# Patient Record
Sex: Female | Born: 1960 | Race: Black or African American | Hispanic: No | Marital: Single | Smoking: Never smoker
Health system: Southern US, Community
[De-identification: ages and names within clinical notes are randomized; demographics above are authoritative.]

## PROBLEM LIST (undated history)

## (undated) DIAGNOSIS — H269 Unspecified cataract: Secondary | ICD-10-CM

## (undated) DIAGNOSIS — E119 Type 2 diabetes mellitus without complications: Secondary | ICD-10-CM

## (undated) DIAGNOSIS — H35039 Hypertensive retinopathy, unspecified eye: Secondary | ICD-10-CM

## (undated) DIAGNOSIS — K5792 Diverticulitis of intestine, part unspecified, without perforation or abscess without bleeding: Secondary | ICD-10-CM

## (undated) DIAGNOSIS — H3320 Serous retinal detachment, unspecified eye: Secondary | ICD-10-CM

## (undated) DIAGNOSIS — I10 Essential (primary) hypertension: Secondary | ICD-10-CM

## (undated) HISTORY — PX: OTHER SURGICAL HISTORY: SHX169

## (undated) HISTORY — DX: Hypertensive retinopathy, unspecified eye: H35.039

## (undated) HISTORY — PX: REPLACEMENT TOTAL KNEE: SUR1224

## (undated) HISTORY — DX: Diverticulitis of intestine, part unspecified, without perforation or abscess without bleeding: K57.92

## (undated) HISTORY — PX: BREAST SURGERY: SHX581

## (undated) HISTORY — DX: Unspecified cataract: H26.9

---
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
## 1998-12-14 ENCOUNTER — Other Ambulatory Visit: Admission: RE | Admit: 1998-12-14 | Discharge: 1998-12-14 | Payer: Self-pay | Admitting: Obstetrics and Gynecology

## 1999-04-14 ENCOUNTER — Other Ambulatory Visit: Admission: RE | Admit: 1999-04-14 | Discharge: 1999-04-14 | Payer: Self-pay | Admitting: Obstetrics and Gynecology

## 2002-03-16 ENCOUNTER — Other Ambulatory Visit: Admission: RE | Admit: 2002-03-16 | Discharge: 2002-03-16 | Payer: Self-pay | Admitting: Obstetrics and Gynecology

## 2002-05-18 ENCOUNTER — Encounter: Payer: Self-pay | Admitting: Obstetrics and Gynecology

## 2002-05-18 ENCOUNTER — Ambulatory Visit (HOSPITAL_COMMUNITY): Admission: RE | Admit: 2002-05-18 | Discharge: 2002-05-18 | Payer: Self-pay | Admitting: Obstetrics and Gynecology

## 2002-08-26 ENCOUNTER — Ambulatory Visit (HOSPITAL_COMMUNITY): Admission: RE | Admit: 2002-08-26 | Discharge: 2002-08-26 | Payer: Self-pay | Admitting: Obstetrics and Gynecology

## 2002-08-26 ENCOUNTER — Encounter: Payer: Self-pay | Admitting: Obstetrics and Gynecology

## 2002-10-01 ENCOUNTER — Inpatient Hospital Stay (HOSPITAL_COMMUNITY): Admission: AD | Admit: 2002-10-01 | Discharge: 2002-10-01 | Payer: Self-pay | Admitting: Obstetrics and Gynecology

## 2002-10-14 ENCOUNTER — Inpatient Hospital Stay (HOSPITAL_COMMUNITY): Admission: AD | Admit: 2002-10-14 | Discharge: 2002-10-14 | Payer: Self-pay | Admitting: Obstetrics and Gynecology

## 2002-10-15 ENCOUNTER — Inpatient Hospital Stay (HOSPITAL_COMMUNITY): Admission: AD | Admit: 2002-10-15 | Discharge: 2002-10-17 | Payer: Self-pay | Admitting: Obstetrics and Gynecology

## 2006-02-27 ENCOUNTER — Other Ambulatory Visit: Admission: RE | Admit: 2006-02-27 | Discharge: 2006-02-27 | Payer: Self-pay | Admitting: Obstetrics and Gynecology

## 2008-02-20 ENCOUNTER — Encounter: Payer: Self-pay | Admitting: Orthopedic Surgery

## 2008-02-20 ENCOUNTER — Ambulatory Visit (HOSPITAL_COMMUNITY): Admission: RE | Admit: 2008-02-20 | Discharge: 2008-02-20 | Payer: Self-pay | Admitting: Pediatrics

## 2008-02-24 ENCOUNTER — Encounter (HOSPITAL_COMMUNITY): Admission: RE | Admit: 2008-02-24 | Discharge: 2008-03-25 | Payer: Self-pay | Admitting: Pediatrics

## 2008-03-30 ENCOUNTER — Encounter (HOSPITAL_COMMUNITY): Admission: RE | Admit: 2008-03-30 | Discharge: 2008-04-29 | Payer: Self-pay | Admitting: Pediatrics

## 2008-04-06 ENCOUNTER — Ambulatory Visit: Payer: Self-pay | Admitting: Orthopedic Surgery

## 2008-04-06 DIAGNOSIS — M1710 Unilateral primary osteoarthritis, unspecified knee: Secondary | ICD-10-CM

## 2008-04-06 DIAGNOSIS — IMO0002 Reserved for concepts with insufficient information to code with codable children: Secondary | ICD-10-CM | POA: Insufficient documentation

## 2010-01-16 ENCOUNTER — Ambulatory Visit: Payer: Self-pay | Admitting: Orthopedic Surgery

## 2010-01-18 ENCOUNTER — Encounter: Payer: Self-pay | Admitting: Orthopedic Surgery

## 2010-05-15 ENCOUNTER — Encounter (INDEPENDENT_AMBULATORY_CARE_PROVIDER_SITE_OTHER): Payer: Self-pay | Admitting: *Deleted

## 2010-05-15 ENCOUNTER — Ambulatory Visit: Payer: Self-pay | Admitting: Orthopedic Surgery

## 2010-05-19 ENCOUNTER — Encounter (INDEPENDENT_AMBULATORY_CARE_PROVIDER_SITE_OTHER): Payer: Self-pay | Admitting: *Deleted

## 2010-05-26 ENCOUNTER — Encounter (INDEPENDENT_AMBULATORY_CARE_PROVIDER_SITE_OTHER): Payer: Self-pay | Admitting: *Deleted

## 2010-06-06 ENCOUNTER — Encounter: Payer: Self-pay | Admitting: Orthopedic Surgery

## 2010-06-09 ENCOUNTER — Inpatient Hospital Stay (HOSPITAL_COMMUNITY): Admission: RE | Admit: 2010-06-09 | Discharge: 2010-06-13 | Payer: Self-pay | Admitting: Orthopedic Surgery

## 2010-06-09 ENCOUNTER — Ambulatory Visit: Payer: Self-pay | Admitting: Orthopedic Surgery

## 2010-06-15 ENCOUNTER — Telehealth: Payer: Self-pay | Admitting: Orthopedic Surgery

## 2010-06-16 ENCOUNTER — Encounter: Payer: Self-pay | Admitting: Orthopedic Surgery

## 2010-06-16 ENCOUNTER — Telehealth: Payer: Self-pay | Admitting: Orthopedic Surgery

## 2010-06-20 ENCOUNTER — Telehealth: Payer: Self-pay | Admitting: Orthopedic Surgery

## 2010-06-20 ENCOUNTER — Encounter: Payer: Self-pay | Admitting: Orthopedic Surgery

## 2010-06-21 ENCOUNTER — Encounter: Payer: Self-pay | Admitting: Orthopedic Surgery

## 2010-06-21 ENCOUNTER — Telehealth: Payer: Self-pay | Admitting: Orthopedic Surgery

## 2010-06-22 ENCOUNTER — Ambulatory Visit: Payer: Self-pay | Admitting: Orthopedic Surgery

## 2010-06-22 DIAGNOSIS — Z96659 Presence of unspecified artificial knee joint: Secondary | ICD-10-CM

## 2010-06-28 ENCOUNTER — Encounter: Payer: Self-pay | Admitting: Orthopedic Surgery

## 2010-07-03 ENCOUNTER — Telehealth (INDEPENDENT_AMBULATORY_CARE_PROVIDER_SITE_OTHER): Payer: Self-pay | Admitting: *Deleted

## 2010-07-11 ENCOUNTER — Encounter (HOSPITAL_COMMUNITY): Admission: RE | Admit: 2010-07-11 | Discharge: 2010-08-10 | Payer: Self-pay | Admitting: Orthopedic Surgery

## 2010-07-24 ENCOUNTER — Ambulatory Visit: Payer: Self-pay | Admitting: Orthopedic Surgery

## 2010-07-25 ENCOUNTER — Encounter: Payer: Self-pay | Admitting: Orthopedic Surgery

## 2010-08-01 ENCOUNTER — Encounter: Payer: Self-pay | Admitting: Orthopedic Surgery

## 2010-08-11 ENCOUNTER — Encounter (HOSPITAL_COMMUNITY): Admission: RE | Admit: 2010-08-11 | Discharge: 2010-09-10 | Payer: Self-pay | Admitting: Orthopedic Surgery

## 2010-08-29 ENCOUNTER — Encounter: Payer: Self-pay | Admitting: Orthopedic Surgery

## 2010-09-04 ENCOUNTER — Ambulatory Visit: Payer: Self-pay | Admitting: Orthopedic Surgery

## 2010-09-07 ENCOUNTER — Encounter: Payer: Self-pay | Admitting: Orthopedic Surgery

## 2010-10-12 ENCOUNTER — Ambulatory Visit: Payer: Self-pay | Admitting: Orthopedic Surgery

## 2011-01-09 NOTE — Assessment & Plan Note (Signed)
Summary: 4 WK RE-CK RT KNEE/TKA SURG 06/09/10/BCBS/CAF   Visit Type:  post op Referring Provider:  Dr. Acey Lav  CC:  right knee replacement.  History of Present Illness: I saw Nancy [name] in the office today for a 1 month followup visit.  She is a 50 years old woman with the complaint of:  right knee replacement.  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  Her knee looks great position looks good.  Flexion 120 extension full extension with no lag  X-rays of the RIGHT knee The  alignment was normal, PTF reduced without tilt or subluxation, no evidence of loosening.  IMPRESSION: normal appearance of the implant    Followup 6 weeks continue therapy    Allergies: 1)  ! Pcn   Other Orders: Post-Op Check ([address]) Knee x-ray,  3 views ([address])  Patient Instructions: 1)  6 weeks f/u

## 2011-01-09 NOTE — Progress Notes (Signed)
Summary: out-patient therapy order request  Phone Note Other Incoming   Caller: [facility] Summary of Call: [facility] called for order for out-patient therapy; states rec'd call to schedule per [facility]. Initial call taken by: Cammie Sickle,  July 03, 2010 2:30 PM  Follow-up for Phone Call        ok order faxed to [facility] pt Follow-up by: Ether Griffins,  July 03, 2010 2:34 PM

## 2011-01-09 NOTE — Assessment & Plan Note (Signed)
Summary: 6 WK F/U RT KNEE/POST OP 06/09/10/BSF   Visit Type:  Follow-up Referring Provider:  Dr. Acey Lav  CC:  right knee replacement.  History of Present Illness: I saw Nancy [name] in the office today for a 6 week  followup visit.  She is a 50 years old woman with the complaint of:  right knee replacement.  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  Medications Robaxin 500.  She has finished therapy.  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 Pcn   Impression & Recommendations:  Problem # 1:  TOTAL KNEE FOLLOW-UP (ICD-V43.65) Assessment Comment Only  Orders: Post-Op Check (42706)  Problem # 2:  LOWER LEG, ARTHRITIS, DEGEN./OSTEO (ICD-715.96) Assessment: Comment Only  Her updated medication list for this problem includes:    Naproxen 500 Mg Tabs (Naproxen)    Norco 5-325 Mg Tabs (Hydrocodone-acetaminophen) .Marland Kitchen... 1 by mouth q 4 as needed pain  Orders: Post-Op Check (23762)  Patient Instructions: 1)  Please schedule a follow-up appointment in 1 month.

## 2011-01-09 NOTE — Letter (Signed)
Summary: Prior review Passive motion form   Prior review Passive motion form   Imported By: Cammie Sickle 06/07/2010 17:19:08  _____________________________________________________________________  External Attachment:    Type:   Image     Comment:   External Document

## 2011-01-09 NOTE — Miscellaneous (Signed)
Summary: [facility] verbal order for equipment  [facility] verbal order for equipment   Imported By: Jacklynn Ganong 06/21/2010 12:01:53  _____________________________________________________________________  External Attachment:    Type:   Image     Comment:   External Document

## 2011-01-09 NOTE — Miscellaneous (Signed)
Summary: [facility] home health discharge  [facility] home health discharge   Imported By: Jacklynn Ganong 07/25/2010 16:52:17  _____________________________________________________________________  External Attachment:    Type:   Image     Comment:   External Document

## 2011-01-09 NOTE — Miscellaneous (Signed)
Summary: faxed info to [facility] and [facility] for surgery info  Clinical Lists Changes

## 2011-01-09 NOTE — Miscellaneous (Signed)
Summary: PT Discharge summary  PT Discharge summary   Imported By: Jacklynn Ganong 08/29/2010 12:20:36  _____________________________________________________________________  External Attachment:    Type:   Image     Comment:   External Document

## 2011-01-09 NOTE — Miscellaneous (Signed)
Summary: [facility] orders  [facility] orders   Imported By: Jacklynn Ganong 06/26/2010 08:00:29  _____________________________________________________________________  External Attachment:    Type:   Image     Comment:   External Document

## 2011-01-09 NOTE — Miscellaneous (Signed)
Summary: pre-cert information in-patient surgery  Clinical Lists Changes  ontacted insurer [facility] re: in-patient surgery scheduled for 7/1/1, [facility], [place] [address], Icd9 [phone]. Spoke w/Pamela R in [employer].  Initially approved for up to 7 days; Pre-Auth # [id number].   06/02/10 Pre-approved with above Berkley Harvey #.  Clinicals would be needed only if in-patient for >7 days, per [facility].

## 2011-01-09 NOTE — Assessment & Plan Note (Signed)
Summary: POST OP 1/TKA RT/SURG 06/09/10/BCBS/CAF   Visit Type:  Follow-up Referring Provider:  Dr. Acey Lav  CC:  postoperative visit status post RIGHT knee replacement.  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  Complaints doing well, still calf and muscle soreness.    Medications Robaxin 500, Oxycodone 2 q 4 hrs as needed pain, Oxycodone 5 immediate release q 3 hrs.  PT ADVANCED   Allergies: 1)  ! Pcn  Physical Exam  Additional Exam:  her incision looks very nice she does have somelower leg edema her flexion is about 75 in the office she is tight in extension about 5     Impression & Recommendations:  Problem # 1:  LOWER LEG, ARTHRITIS, DEGEN./OSTEO (ICD-715.96) Assessment Comment Only  continued therapy him back 4 weeks no x-ray needed  Orders: Post-Op Check (21308)  Patient Instructions: 1)  Come back in 4 weeks 2)  recheck right knee

## 2011-01-09 NOTE — Letter (Signed)
Summary: [employer] disability form  [employer] disability form   Imported By: Cammie Sickle 06/20/2010 09:54:35  _____________________________________________________________________  External Attachment:    Type:   Image     Comment:   External Document

## 2011-01-09 NOTE — Miscellaneous (Signed)
Summary: Home care PT progress note  Home care PT progress note   Imported By: Jacklynn Ganong 06/26/2010 07:59:48  _____________________________________________________________________  External Attachment:    Type:   Image     Comment:   External Document

## 2011-01-09 NOTE — Progress Notes (Signed)
Summary: call from patient, states CPM denied  Phone Note Call from Patient   Caller: Patient Summary of Call: Patient called to relay that her insurance denied the CPM machine and is very concerned.  I foll'd up with [employer], our CPM/medical equipment provider via email.  Per Racheal Bond, states they rec'd the requested form signed by Dr Romeo Apple; however, [facility] still denied based on " patient low mobility".   Advised patient, we are checking for further information. Initial call taken by: Cammie Sickle,  June 15, 2010 4:33 PM

## 2011-01-09 NOTE — Progress Notes (Signed)
Summary: When to check protime again?  Phone Note From Other Clinic   Summary of Call: Jennifer/[facility] called about Nancy [name] (Jul 27, 2061).  Protime was  done 06/16/10, tomorrow, 06/21/10 is her last home visit that insurance will allow.  Do you want it checked again tomorrow or stop the Coumadin?  Jennifer's # [phone] Initial call taken by: Jacklynn Ganong,  June 20, 2010 3:41 PM  Follow-up for Phone Call        check tomorrow Follow-up by: Fuller Canada MD,  June 20, 2010 5:05 PM  Additional Follow-up for Phone Call Additional follow up Details #1::        Gave doctor's reply to [facility]  at [facility] Additional Follow-up by: Jacklynn Ganong,  June 20, 2010 5:12 PM

## 2011-01-09 NOTE — Miscellaneous (Signed)
Summary: [facility] plan of care  [facility] plan of care   Imported By: Jacklynn Ganong 06/29/2010 10:33:35  _____________________________________________________________________  External Attachment:    Type:   Image     Comment:   External Document

## 2011-01-09 NOTE — Miscellaneous (Signed)
Summary: PT Clinical evaluation  PT Clinical evaluation   Imported By: Jacklynn Ganong 08/01/2010 11:38:43  _____________________________________________________________________  External Attachment:    Type:   Image     Comment:   External Document

## 2011-01-09 NOTE — Assessment & Plan Note (Signed)
Summary: RT KNEE PAIN NO RECENT XR/BCBS/R TORRENS/BSF   Visit Type:  Follow-up Referring Provider:  Dr. Acey Lav  CC:  right knee pain.  History of Present Illness: I saw Nancy [name] in the office today for a followup visit.  She is a 50 years old woman with the complaint of:  right knee pain.I last saw her one year ago at that time she was having knee pain and she started having pain again in November of this year.  She complains of pain in the entire knee which is worsened by cold air.  We treated her last year with a brace and physical therapy and she did well.  He started having severe swelling again in the RIGHT knee.  She has severe pain at this time.  There is deformity.  meds: Flexeril 10mg  and Naproxen 500mg , Voltaren gel.  Xrays today.  Current Medications (verified): 1)  Naproxen 500 Mg  Tabs (Naproxen) 2)  Albuterol Inhaler .... Prn  Allergies (verified): 1)  ! Pcn  Past History:  Past Medical History: Last updated: 04/06/2008 asthma  Past Surgical History: Last updated: 04/06/2008 right foot  Family History: Last updated: 04/06/2008 FH of Anesthesia Problems:  Family History of Diabetes  Social History: Last updated: 04/06/2008 Patient is single.  work with computers  Risk Factors: Caffeine Use: 0 (04/06/2008)  Risk Factors: Smoking Status: never (04/06/2008)  Review of Systems General:  Denies weight loss, weight gain, fever, chills, and fatigue. Cardiac :  Denies chest pain, angina, and heart attack. Neuro:  complains of numbness in the second and third digit of the LEFT foot.  Physical Exam  Msk:  The patient is well developed and nourished, with normal grooming and hygiene. The body habitus is   Pulses:  pulses normal in all 4 extremities Extremities:  the gait and station were abnormal and she favored the RIGHT lower extremity  She has normal upper extremities on inspection.  She has full range of motion.  There was no evidence of joint  subluxation or laxity.  There is no atrophy or tremor muscle tone was normal.  Skin was intact.  RIGHT lower extremity showed large joint effusion she had medial tenderness as well as lateral tenderness or suprapatellar tenderness.  Range of motion was limited by the effusion to only 90.  There is no joint contracture.  The knee was stable.  Muscle strength and tone were normal.  Skin was normal. Neurologic:  The coordination and sensation were normal  The reflexes were normal   Skin:  intact without lesions or rashes Inguinal Nodes:  no significant adenopathy Psych:  alert and cooperative; normal mood and affect; normal attention span and concentration   Impression & Recommendations:  Problem # 1:  LOWER LEG, ARTHRITIS, DEGEN./OSTEO (ICD-715.96) Assessment Deteriorated  Her updated medication list for this problem includes:    Naproxen 500 Mg Tabs (Naproxen)  Orders: New Patient Level IV (72536) Joint Aspirate / Injection, Large (20610) Depo- Medrol 40mg  (J1030) Knee x-ray,  3 views (64[address])  There is severe arthritis of the knee.  There is joint space narrowing and osteophytes.  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  I agree that she needs surgery.  Patient Instructions: 1)  You have received an injection of cortisone today. You may experience increased pain at the injection site. Apply ice pack to the area for 20 minutes every 2 hours  and take 2 xtra strength tylenol every 8 hours. This increased pain will usually resolve in 24 hours. The injection will take effect in 3-10 days.  2)  return end of May for preop

## 2011-01-09 NOTE — Progress Notes (Signed)
Summary: PT/INR results  Phone Note From Other Clinic   Summary of Call: [facility] called about Nancy [name] (September 04, 2061)     INR  1.4   PT   16.3  she is taking 5 mg Coumadin  Cathy's # [phone] Initial call taken by: Jacklynn Ganong,  June 16, 2010 9:20 AM

## 2011-01-09 NOTE — Letter (Signed)
Summary: *Orthopedic Consult Note  [facility]  [address]  [place], [place] [address]   Phone: [phone]  Fax: [phone]    Re:    Nancy [name] DOB:    [birth year]   Dear: Brett Canales   Thank you for requesting that we see the above patient for consultation.  A copy of the detailed office note will be sent under separate cover, for your review.  Evaluation today is consistent with:    Our recommendation is for: RIGHT total knee arthroplasty, the patient like to wait until June, we'll see her in May to schedule       Thank you for this opportunity to look after your patient.  Sincerely,   Terrance Mass. MD.

## 2011-01-09 NOTE — Letter (Signed)
Summary: Work [facility]  [address]  [place], [place] [address]   Phone: [phone]  Fax: [phone]     Today's Date: January 16, 2010  Name of Patient: Nancy [name]  The above named patient had a medical visit today at: 2:00 pm.  Please take this into consideration when reviewing the time away from work/school.    Special Instructions:    [ X ] To be off the remainder of today, released to return to the normal work / school schedule tomorrow, or next scheduled work day.   [  ] Other ________________________________________________________________ ________________________________________________________________________   Sincerely,   Terrance Mass, MD

## 2011-01-09 NOTE — Letter (Signed)
Summary: Work [facility]  [address]  [place], [place] [address]   Phone: [phone]  Fax: [phone]     Today's Date: May 15, 2010  Name of Patient: Nancy [name]  The above named patient had a medical visit in our office today.  Please take this into consideration when reviewing the time away from work/school.    Special Instructions:   [ X ] To be off the remainder of today, returning to the normal work schedule tomorrow,       05/16/10..  [  ] To be off until the next scheduled appointment on ______________________.  [  ] Other ________________________________________________________________ ________________________________________________________________________   Sincerely,,   Terrance Mass, MD

## 2011-01-09 NOTE — Miscellaneous (Signed)
Summary: PT Progress note  PT Progress note   Imported By: Jacklynn Ganong 08/01/2010 11:24:45  _____________________________________________________________________  External Attachment:    Type:   Image     Comment:   External Document

## 2011-01-09 NOTE — Letter (Signed)
Summary: Out of Work Part time job  [facility]  [address]  [place], [place] [address]   Phone: [phone]  Fax: [phone]    May 15, 2010   Employee:  Nancy [name]    To Whom It May Concern:    For Medical reasons, please note that the above named employee's last day to be available for work is the following dates:  Tuesday,  05/30/10   Out of work:  Start:  06/05/10  End:   09/05/10  Estimated return to work:  09/11/10    If you need additional information, please feel free to contact our office.         Sincerely,    Terrance Mass, MD

## 2011-01-09 NOTE — Assessment & Plan Note (Signed)
Summary: 1 M RE-CK RT KNEE/POST OP TKA 06/09/10 BCBS/CAF   Visit Type:  Follow-up Referring Provider:  Dr. Acey Lav  CC:  post op TKA.  History of Present Illness: I saw Nancy [name] in the office today for a 6 week  followup visit.  She is a 50 years old woman with the complaint of:  right knee replacement.  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  Medications Robaxin 500, Norco 5 as needed.  Today she complains of stiffness after sitting for a long period of time and there is some weakness when she is walking including going up the stairs.  She denies any back pain.  She does use her cane occasionally.   on examination her knee is completely straight and flat she has flexion of 110.  She has a good straight leg raise.  Her knee is stable.  No tenderness is noted and there is free range of motion with no patellar subluxation  I recommended some knee exercises straight leg raise, terminal knee extension quad sets to help with her strength and followup in 3 months for x-rays of the total knee replacement  Allergies: 1)  ! Pcn   Medications Added to Medication List This Visit: 1)  Robaxin 500 Mg Tabs (Methocarbamol) .Marland Kitchen.. 1 by mouth q 8 hrs as needed pain  Other Orders: Est. Patient Level II ([address])  Patient Instructions: 1)  CONTINUE KNEE EXERCISES  2)  RETURN IN 3 MONTHS FOR XRAYS OF THE RIGHT KNEE  Prescriptions: ROBAXIN 500 MG TABS (METHOCARBAMOL) 1 by mouth q 8 hrs as needed pain  #60 x 1   Entered and Authorized by:   Fuller Canada MD   Signed by:   Fuller Canada MD on 10/12/2010   Method used:   Faxed to ...       [facility]* (retail)       [address]       [place], [place]  [address]       Ph: [phone]       Fax: [phone]   RxID:   [id number]    Orders Added: 1)  Est. Patient Level II [[address]]

## 2011-01-09 NOTE — Letter (Signed)
Summary: Out of Work  [facility]  [address]  [place], [place] [address]   Phone: [phone]  Fax: [phone]    September 04, 2010   Employee:  Hildagarde L Para    To Whom It May Concern:   May return to work Oct 3rd 2011     Sincerely,    Fuller Canada MD

## 2011-01-09 NOTE — Progress Notes (Signed)
Summary: call back from [facility]  Phone Note Other Incoming   Caller: Home health nurse Summary of Call: Jennifer,[facility], called to report no pro time was done, as patient had stopped her Coumadin on Sun, 06/18/10. Said that today is her last scheduled visit w/patient.  Fol up appt scheduled here tomorrow, 06/22/10. Initial call taken by: Cammie Sickle,  June 21, 2010 12:37 PM

## 2011-01-09 NOTE — Assessment & Plan Note (Signed)
Summary: RE-CK/DISCUSS SURGERY RT KNEE/?NEW XRAY/BCBS/CAF   Visit Type:  Follow-up Referring Provider:  Dr. Acey Lav  CC:  right knee pain.  History of Present Illness: I saw Nancy [name] in the office today for a followup visit.  She is a 50 years old woman with the complaint of:  right knee pain, OA, discuss knee surgery.  She c/o severe knee pain which was gradual in onset and not associated with mechanical signs. She was treated with thearpy, bracing and naproxen and she improved. She had a xray done 02/20/08 at [facility].   She also had an injection and her pain was resolved for a while but then came back.  It is now severe.  She lost motion.  She has swelling.  She can walk up steps.  She would like to proceed with knee replacement surgery.   Updated Prior Medication List:  NAPROXEN 500 MG  TABS (NAPROXEN)  * ALBUTEROL INHALER prn  Current Allergies: ! PCN  Past Medical History:    asthma  Past Surgical History:    right foot   Family History:    FH of Anesthesia Problems:     Family History of Diabetes  Social History:    Patient is single.     work with computers   Risk Factors:   Tobacco use:  never Caffeine use:  0 drinks per day Alcohol use:  no     Allergies: 1)  ! Pcn  Past History:  Past Medical History: Last updated: 04/06/2008 asthma  Past Surgical History: Last updated: 04/06/2008 right foot  Family History: Last updated: 04/06/2008 FH of Anesthesia Problems:  Family History of Diabetes  Social History: Last updated: 04/06/2008 Patient is single.  work with computers  Risk Factors: Caffeine Use: 0 (04/06/2008)  Risk Factors: Smoking Status: never (04/06/2008)  Physical Exam  Additional Exam:  Knee Exam  General:    Well-developed, well-nourished, normal body habitus; no deformities, normal grooming.  Gait:    Normal heel-toe gait pattern bilaterally.    Skin:    Intact, no scars, lesions, rashes, cafe au lait spots,  or bruising.    Inspection:     No deformity, ecchymosis or swelling.   Palpation:    Non-tender to palpation over medial joint line, lateral joint line, parapatellar, condylar, patellar tendon, or Pes bursa.   Vascular:    There was no swelling or varicose veins. The pulses and temperature are normal. There was no edema or tenderness.  Sensory:    Gross coordination and sensation were normal.    Motor:    Motor strength 5/5 bilaterally for quadriceps, hamstrings, ankle dorsiflexion, and ankle plantar flexion.    Reflexes:    Normal and symmetric patellar and Achilles reflexes bilaterally.    Knee Exam:         she is ambulating with a limp favoring the RIGHT leg.  The RIGHT leg is in valgus.  She has only 90 of flexion and has painful knee extension and terminal knee extension from 5 to 0.  Her motor exam is normal the knee is stable she has lateral joint line tenderness and patellofemoral crepitance    the LEFT knee has 115 of flexion and full extension mild valgus no pain no tenderness no swelling normal muscle function and it is stable   The upper extremities have normal appearance, ROM, strength and stability.         Impression & Recommendations:  Problem # 1:  LOWER LEG, ARTHRITIS, DEGEN./OSTEO (  ICD-715.96) Assessment Deteriorated  We have x-rays which show that she has valgus arthritis looks moderate on x-ray.  Not severe in terms of deformity is mild to moderate.  We have recommended RIGHT total knee arthroplasty       Norco 5-325 Mg Tabs (Hydrocodone-acetaminophen) .Marland Kitchen... 1 by mouth q 4 as needed pain  Orders: Est. Patient Level IV ([address])  Medications Added to Medication List This Visit: 1)  Norco 5-325 Mg Tabs (Hydrocodone-acetaminophen) .Marland Kitchen.. 1 by mouth q 4 as needed pain  Patient Instructions: 1)  Informed consent: The risks of this  procedure are:  bleeding, infection, nerve and vascualr injury have been discussed. The diagnosis and reason for  surgery have been explained. The patient demonstrates understanding of this discussion. Specific to this procedure risks include:  2)  Stiffness 3)  Pain 4)  Infection-can lead to further surgery and amputation 5)  DOS 06/09/10 6)  Preop at [facility] on 06/02/10 at 10:30am, take packet with you. 7)  Post op 1 in our office on 06/22/10 Prescriptions: NORCO 5-325 MG TABS (HYDROCODONE-ACETAMINOPHEN) 1 by mouth q 4 as needed pain  #42 x 3   Entered and Authorized by:   Fuller Canada MD   Signed by:   Fuller Canada MD on 05/15/2010   Method used:   Print then Give to Patient   RxID:   [id number]

## 2011-01-09 NOTE — Letter (Signed)
Summary: [employer] disab form  [employer] disab form   Imported By: Cammie Sickle 09/30/2010 19:20:40  _____________________________________________________________________  External Attachment:    Type:   Image     Comment:   External Document

## 2011-01-09 NOTE — Letter (Signed)
Summary: Out of Work Full time job note  [facility]  [address]  [place], [place] [address]   Phone: [phone]  Fax: [phone]    May 15, 2010   Employee:  Nancy [name]    To Whom It May Concern:   For Medical reasons, please excuse the above named employee from work for the following dates, secondary to surgery:  Start:   06/09/2010  End:   09/11/2010  Estimated return to work date:  09/11/2010   If you need additional information, please feel free to contact our office.         Sincerely,    Terrance Mass, MD

## 2011-01-09 NOTE — Letter (Signed)
Summary: Medical Modal Continous passive motion form  Medical Modal Continous passive motion form   Imported By: Cammie Sickle 06/15/2010 17:03:23  _____________________________________________________________________  External Attachment:    Type:   Image     Comment:   External Document

## 2011-01-09 NOTE — Miscellaneous (Signed)
Summary: [facility] PT/INR report  [facility] PT/INR report   Imported By: Jacklynn Ganong 06/20/2010 15:21:00  _____________________________________________________________________  External Attachment:    Type:   Image     Comment:   External Document

## 2011-02-06 ENCOUNTER — Ambulatory Visit (INDEPENDENT_AMBULATORY_CARE_PROVIDER_SITE_OTHER): Payer: BC Managed Care – PPO | Admitting: Orthopedic Surgery

## 2011-02-06 ENCOUNTER — Encounter: Payer: Self-pay | Admitting: Orthopedic Surgery

## 2011-02-06 DIAGNOSIS — M1710 Unilateral primary osteoarthritis, unspecified knee: Secondary | ICD-10-CM

## 2011-02-06 DIAGNOSIS — Z96659 Presence of unspecified artificial knee joint: Secondary | ICD-10-CM

## 2011-02-08 ENCOUNTER — Encounter: Payer: Self-pay | Admitting: Orthopedic Surgery

## 2011-02-15 NOTE — Assessment & Plan Note (Signed)
Summary: 4 M FOL/UP RT TKA/XRAY/BCBS/CAF (We r/s from 01/15/10,Patient w...   Visit Type:  Follow-up Referring Provider:  Dr. Acey Lav  CC:  right knee replacement.  History of Present Illness: I saw Nancy [name] in the office today for a 3 since her last visit.  She is a 50 year old female who had a RIGHT knee replacement Friday, June 09, 2010 with a [place] implant  Findings Sever OA of patella and femur.  Medications Robaxin 500, Norco 5 as needed.  scheduled for x-ray evaluation today.  Patient has no complaints    Allergies: 1)  ! Pcn  Review of Systems Musculoskeletal:  Denies joint pain, swelling, instability, stiffness, redness, heat, and muscle pain.  Physical Exam  Additional Exam:   Her appearance was normal as well groomed  She had good pulse and perfusion to the RIGHT lower extremity.  Skin incision looked nice and no tenderness negative Tinel's  Psych exam was normal she was awake and alert  She walked unsupported normal heel strike.  No tenderness or swelling about the knee and flexion angle is 115.  Joint showed no laxity.   Impression & Recommendations:  Problem # 1:  TOTAL KNEE FOLLOW-UP (ICD-V43.65)  Separate and Identifiable X-Ray report      RIGHT knee  The  alignment was normal, PTF reduced without tilt or subluxation, no evidence of loosening.  IMPRESSION: normal appearance of the implant   Orders: Est. Patient Level III (16109) Knee x-ray,  3 views (60454)  Problem # 2:  LOWER LEG, ARTHRITIS, DEGEN./OSTEO (ICD-715.96)  Her updated medication list for this problem includes:    Naproxen 500 Mg Tabs (Naproxen)    Norco 5-325 Mg Tabs (Hydrocodone-acetaminophen) .Marland Kitchen... 1 by mouth q 4 as needed pain    Robaxin 500 Mg Tabs (Methocarbamol) .Marland Kitchen... 1 by mouth q 8 hrs as needed pain  Orders: Est. Patient Level III (09811)  Medications Added to Medication List This Visit: 1)  Prednisone (pak) 5 Mg Tabs (Prednisone) .... As  directed  Patient Instructions: 1)  July 1 year f/u xrays TKA right leg  2)  take dose pack for hip pain  Prescriptions: PREDNISONE (PAK) 5 MG TABS (PREDNISONE) as directed  #1 x 1   Entered and Authorized by:   Fuller Canada MD   Signed by:   Fuller Canada MD on 02/06/2011   Method used:   Print then Give to Patient   RxID:   [id number]    Orders Added: 1)  Est. Patient Level III [78469] 2)  Knee x-ray,  3 views [73562]

## 2011-02-15 NOTE — Letter (Signed)
Summary: Out of Work  [facility]  [address]  [place], [place] [address]   Phone: [phone]  Fax: [phone]    February 06, 2011   Employee:  Nancy [name]    To Whom It May Concern:   For Medical reasons, please excuse the above named employee from work for the following dates:  Start:   02/06/11 - appointment in our office today  End/return to work:   02/07/11  If you need additional information, please feel free to contact our office.         Sincerely,    Terrance Mass, MD

## 2011-02-25 LAB — BASIC METABOLIC PANEL
BUN: 10 mg/dL (ref 6–23)
BUN: 6 mg/dL (ref 6–23)
CO2: 29 mEq/L (ref 19–32)
CO2: 30 mEq/L (ref 19–32)
Calcium: 9 mg/dL (ref 8.4–10.5)
Calcium: 8.8 mg/dL (ref 8.4–10.5)
Chloride: 101 mEq/L (ref 96–112)
Chloride: 99 mEq/L (ref 96–112)
Creatinine, Ser: 0.81 mg/dL (ref 0.4–1.2)
GFR calc Af Amer: >60 mL/min (ref >60)
GFR calc Af Amer: >60 mL/min (ref >60)
GFR calc non Af Amer: >60 mL/min (ref >60)
Glucose, Bld: 104 mg/dL — ABNORMAL HIGH (ref 70–99)
Glucose, Bld: 140 mg/dL — ABNORMAL HIGH (ref 70–99)
Potassium: 3.8 mEq/L (ref 3.5–5.1)
Sodium: 137 mEq/L (ref 135–145)
Sodium: 136 mEq/L (ref 135–145)

## 2011-02-25 LAB — PROTIME-INR
INR: 1.18 (ref 0.00–1.49)
INR: 0.92 (ref 0.00–1.49)
Prothrombin Time: 15.3 seconds — ABNORMAL HIGH (ref 11.6–15.2)
Prothrombin Time: 12.6 seconds (ref 11.6–15.2)

## 2011-02-25 LAB — CROSSMATCH

## 2011-02-25 LAB — DIFFERENTIAL
Basophils Relative: 0 % (ref 0–1)
Lymphs Abs: 1.8 10*3/uL (ref 0.7–4.0)
Monocytes Relative: 8 % (ref 3–12)
Neutro Abs: 3.3 10*3/uL (ref 1.7–7.7)
Neutrophils Relative %: 57 % (ref 43–77)

## 2011-02-25 LAB — LIPASE, BLOOD: Lipase: 19 U/L (ref 11–59)

## 2011-02-25 LAB — CBC
HCT: 31.8 % — ABNORMAL LOW (ref 36.0–46.0)
HCT: 34.2 % — ABNORMAL LOW (ref 36.0–46.0)
Hemoglobin: 12.9 g/dL (ref 12.0–15.0)
MCH: 27.3 pg (ref 26.0–34.0)
MCH: 27.7 pg (ref 26.0–34.0)
MCV: 82.7 fL (ref 78.0–100.0)
MCV: 82.9 fL (ref 78.0–100.0)
MCV: 83.1 fL (ref 78.0–100.0)
Platelets: 189 10*3/uL (ref 150–400)
RBC: 3.83 MIL/uL — ABNORMAL LOW (ref 3.87–5.11)
RBC: 4.26 MIL/uL (ref 3.87–5.11)
RBC: 4.71 MIL/uL (ref 3.87–5.11)
RDW: 15.7 % — ABNORMAL HIGH (ref 11.5–15.5)
RDW: 15.7 % — ABNORMAL HIGH (ref 11.5–15.5)
WBC: 11.4 10*3/uL — ABNORMAL HIGH (ref 4.0–10.5)
WBC: 11.7 10*3/uL — ABNORMAL HIGH (ref 4.0–10.5)

## 2011-02-25 LAB — SURGICAL PCR SCREEN
MRSA, PCR: NEGATIVE
Staphylococcus aureus: NEGATIVE

## 2011-02-26 ENCOUNTER — Telehealth: Payer: Self-pay | Admitting: Orthopedic Surgery

## 2011-03-12 ENCOUNTER — Other Ambulatory Visit: Payer: Self-pay | Admitting: *Deleted

## 2011-03-12 DIAGNOSIS — R52 Pain, unspecified: Secondary | ICD-10-CM

## 2011-03-12 MED ORDER — HYDROCODONE-ACETAMINOPHEN 5-325 MG PO TABS: 1.0000 | ORAL_TABLET | ORAL | Status: DC | PRN

## 2011-03-13 NOTE — Progress Notes (Signed)
Summary: Patient still having back pain.  Phone Note Call from Patient   Caller: Patient Summary of Call: Patient calling stating her back is still hurting, was given a Dose pack on last visit. She has finished the pack, states she has a refill. She is also taking muscle relaxers that she had from her knee replacement. Her [phone]. Initial call taken by: Waldon Reining,  February 26, 2011 8:37 AM  Follow-up for Phone Call        schedule for 6 weeks PT for her back  Follow-up by: Fuller Canada MD,  February 26, 2011 12:19 PM  Additional Follow-up for Phone Call Additional follow up Details #1::        Routed to the nurse for PT order. Patient's best # is [phone] Additional Follow-up by: Jacklynn Ganong,  February 26, 2011 2:14 PM

## 2011-03-19 ENCOUNTER — Ambulatory Visit (HOSPITAL_COMMUNITY)
Admission: RE | Admit: 2011-03-19 | Discharge: 2011-03-19 | Disposition: A | Discharge status: Home / Self Care | Payer: BC Managed Care – PPO | Source: Ambulatory Visit | Attending: Orthopedic Surgery | Admitting: Orthopedic Surgery

## 2011-03-19 ENCOUNTER — Other Ambulatory Visit: Payer: Self-pay | Admitting: *Deleted

## 2011-03-19 DIAGNOSIS — IMO0001 Reserved for inherently not codable concepts without codable children: Secondary | ICD-10-CM | POA: Insufficient documentation

## 2011-03-19 DIAGNOSIS — M6281 Muscle weakness (generalized): Secondary | ICD-10-CM | POA: Insufficient documentation

## 2011-03-19 DIAGNOSIS — M545 Low back pain, unspecified: Secondary | ICD-10-CM | POA: Insufficient documentation

## 2011-03-19 DIAGNOSIS — M549 Dorsalgia, unspecified: Secondary | ICD-10-CM

## 2011-03-19 DIAGNOSIS — R262 Difficulty in walking, not elsewhere classified: Secondary | ICD-10-CM | POA: Insufficient documentation

## 2011-03-22 ENCOUNTER — Ambulatory Visit (HOSPITAL_COMMUNITY)
Admission: RE | Admit: 2011-03-22 | Discharge: 2011-03-22 | Disposition: A | Discharge status: Home / Self Care | Payer: BC Managed Care – PPO | Source: Ambulatory Visit | Attending: Pediatrics | Admitting: Pediatrics

## 2011-03-26 ENCOUNTER — Ambulatory Visit (HOSPITAL_COMMUNITY)
Admission: RE | Admit: 2011-03-26 | Discharge: 2011-03-26 | Disposition: A | Discharge status: Home / Self Care | Payer: BC Managed Care – PPO | Source: Ambulatory Visit | Attending: Pediatrics | Admitting: Pediatrics

## 2011-03-27 ENCOUNTER — Ambulatory Visit (HOSPITAL_COMMUNITY)
Admission: RE | Admit: 2011-03-27 | Discharge: 2011-03-27 | Disposition: A | Discharge status: Home / Self Care | Payer: BC Managed Care – PPO | Source: Ambulatory Visit | Attending: Pediatrics | Admitting: Pediatrics

## 2011-03-29 ENCOUNTER — Ambulatory Visit (HOSPITAL_COMMUNITY)
Admission: RE | Admit: 2011-03-29 | Discharge: 2011-03-29 | Disposition: A | Discharge status: Home / Self Care | Payer: BC Managed Care – PPO | Source: Ambulatory Visit | Attending: Pediatrics | Admitting: Pediatrics

## 2011-04-02 ENCOUNTER — Ambulatory Visit (HOSPITAL_COMMUNITY)
Admission: RE | Admit: 2011-04-02 | Discharge: 2011-04-02 | Disposition: A | Discharge status: Home / Self Care | Payer: BC Managed Care – PPO | Source: Ambulatory Visit | Attending: Pediatrics | Admitting: Pediatrics

## 2011-04-05 ENCOUNTER — Ambulatory Visit (HOSPITAL_COMMUNITY)
Admission: RE | Admit: 2011-04-05 | Discharge: 2011-04-05 | Disposition: A | Discharge status: Home / Self Care | Payer: BC Managed Care – PPO | Source: Ambulatory Visit | Attending: Pediatrics | Admitting: Pediatrics

## 2011-04-09 ENCOUNTER — Ambulatory Visit (HOSPITAL_COMMUNITY): Payer: BC Managed Care – PPO | Admitting: *Deleted

## 2011-04-11 ENCOUNTER — Ambulatory Visit (HOSPITAL_COMMUNITY): Payer: BC Managed Care – PPO | Admitting: *Deleted

## 2011-04-12 ENCOUNTER — Ambulatory Visit (HOSPITAL_COMMUNITY)
Admission: RE | Admit: 2011-04-12 | Discharge: 2011-04-12 | Disposition: A | Discharge status: Home / Self Care | Payer: BC Managed Care – PPO | Source: Ambulatory Visit | Attending: Pediatrics | Admitting: Pediatrics

## 2011-04-12 DIAGNOSIS — M545 Low back pain, unspecified: Secondary | ICD-10-CM | POA: Insufficient documentation

## 2011-04-12 DIAGNOSIS — M6281 Muscle weakness (generalized): Secondary | ICD-10-CM | POA: Insufficient documentation

## 2011-04-12 DIAGNOSIS — IMO0001 Reserved for inherently not codable concepts without codable children: Secondary | ICD-10-CM | POA: Insufficient documentation

## 2011-04-16 ENCOUNTER — Ambulatory Visit (HOSPITAL_COMMUNITY): Payer: BC Managed Care – PPO | Admitting: *Deleted

## 2011-04-18 ENCOUNTER — Ambulatory Visit (HOSPITAL_COMMUNITY): Payer: BC Managed Care – PPO | Admitting: *Deleted

## 2011-04-19 ENCOUNTER — Ambulatory Visit (HOSPITAL_COMMUNITY): Payer: BC Managed Care – PPO | Admitting: *Deleted

## 2011-04-27 NOTE — Discharge Summary (Signed)
NAME:  Nancy [name], Nancy [name]                          ACCOUNT NO.:  [id number]   MEDICAL RECORD NO.:  [id number]                   PATIENT TYPE:  INP   LOCATION:  9124                                 FACILITY:  [facility]   PHYSICIAN:  Huel Cote, M.D.              DATE OF BIRTH:  25-Jul-1961   DATE OF ADMISSION:  10/15/2002  DATE OF DISCHARGE:  10/17/2002                                 DISCHARGE SUMMARY   DISCHARGE DIAGNOSES:  1. Term pregnancy at 39 weeks delivered.  2. Status post normal spontaneous vaginal delivery.  3. Advanced maternal age with a normal triple screen, normal ultrasound;     declined amniocentesis.  4. Prenatal care was complicated by size greater than dates with an     ultrasound performed on August 26, 2002.  This revealed a fetus that     was 2300 g and an amniotic fluid index on the upper limits of normal at     17.   PAST OBSTETRICAL HISTORY:  Significant for a normal spontaneous vaginal  delivery at 40 weeks of a 7 pound 9 ounce infant.  This child died later at  age 74 of a glioma.  In 1986 she had a normal spontaneous vaginal delivery  at 40 weeks of an 8 pound 15 ounce infant with no complications.   PRENATAL LABORATORY DATA THIS PREGNANCY:  B positive.  RPR nonreactive.  Rubella immune.  Hepatitis B surface antigen negative.  GC negative,  chlamydia negative.  Triple screen normal.  Pap normal.  One hour Glucola  was 140; therefore, a three-hour Glucola performed was 76, 182, 93, 188.  She was group B strep positive.   PAST GYNECOLOGICAL HISTORY:  Significant for an abnormal Pa smear in 2000  with a benign colposcopy.   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   PAST SURGICAL HISTORY:  In 2001 she had a right foot bone graft performed  and a right breast cyst removed.   ALLERGIES:  PENICILLIN.   MEDICATIONS:  None.   HOSPITAL COURSE:  On admission she was afebrile with stable vital signs;  fetal heart rate was  reactive.  Cervix quickly progressed from 4 cm at  initial admission to 8 cm and the patient was noted to be vertex with a  bulging bag of water.  She had positive group B strep which was resistant to  erythromycin and clindamycin and therefore was placed on Ancef prophylaxis.  Her bag of water was ruptured after admission and group B strep prophylaxis,  and she quickly progressed and pushed well with a normal spontaneous vaginal  delivery of an 8 pound 3 ounce female infant; Apgars were 9 and 9.  She had  a second degree laceration which was repaired with 3-0 Vicryl with a local  block.  Estimated blood loss was less than 500 cc.  She did well postpartum  and on postpartum day #2 was stable, pain was well controlled, she was  afebrile with stable vital signs, and therefore was felt stable for  discharge home.                                               Huel Cote, M.D.    KR/MEDQ  D:  10/17/2002  T:  10/19/2002  Job:  [id number]

## 2013-11-24 ENCOUNTER — Encounter: Payer: Self-pay | Admitting: Internal Medicine

## 2014-03-11 ENCOUNTER — Encounter: Payer: Self-pay | Admitting: Family Medicine

## 2014-03-11 ENCOUNTER — Ambulatory Visit (INDEPENDENT_AMBULATORY_CARE_PROVIDER_SITE_OTHER): Payer: BC Managed Care – PPO | Admitting: Family Medicine

## 2014-03-11 VITALS — BP 140/90 | HR 86 | Temp 97.8°F | Resp 20 | Ht 63.0 in | Wt 234.0 lb

## 2014-03-11 DIAGNOSIS — R7309 Other abnormal glucose: Secondary | ICD-10-CM

## 2014-03-11 DIAGNOSIS — R030 Elevated blood-pressure reading, without diagnosis of hypertension: Secondary | ICD-10-CM

## 2014-03-11 DIAGNOSIS — R5383 Other fatigue: Secondary | ICD-10-CM

## 2014-03-11 DIAGNOSIS — R7303 Prediabetes: Secondary | ICD-10-CM

## 2014-03-11 DIAGNOSIS — R0602 Shortness of breath: Secondary | ICD-10-CM

## 2014-03-11 DIAGNOSIS — M25561 Pain in right knee: Secondary | ICD-10-CM | POA: Insufficient documentation

## 2014-03-11 DIAGNOSIS — R5381 Other malaise: Secondary | ICD-10-CM

## 2014-03-11 DIAGNOSIS — R609 Edema, unspecified: Secondary | ICD-10-CM

## 2014-03-11 DIAGNOSIS — J309 Allergic rhinitis, unspecified: Secondary | ICD-10-CM

## 2014-03-11 DIAGNOSIS — Z299 Encounter for prophylactic measures, unspecified: Secondary | ICD-10-CM

## 2014-03-11 DIAGNOSIS — M25569 Pain in unspecified knee: Secondary | ICD-10-CM

## 2014-03-11 DIAGNOSIS — R739 Hyperglycemia, unspecified: Secondary | ICD-10-CM

## 2014-03-11 MED ORDER — ALBUTEROL SULFATE HFA 108 (90 BASE) MCG/ACT IN AERS: 2.0000 | INHALATION_SPRAY | Freq: Four times a day (QID) | RESPIRATORY_TRACT | Status: AC | PRN

## 2014-03-11 MED ORDER — DICLOFENAC SODIUM 3 % TD GEL: TRANSDERMAL | Status: DC

## 2014-03-11 MED ORDER — FLUTICASONE PROPIONATE 50 MCG/ACT NA SUSP: 2.0000 | Freq: Every day | NASAL | Status: DC

## 2014-03-11 NOTE — Progress Notes (Signed)
Subjective:     Patient ID: Nancy [name], female   DOB: [birth year], 53 y.o.   MRN: [id number]  Knee Pain  The incident occurred more than 1 week ago. Incident location: no particular incident. She's had chronic knee pain. Does have a hx of right knee replacement. There was no injury mechanism. The pain is present in the right knee. The quality of the pain is described as aching. The pain is at a severity of 4/10. The pain is mild. The pain has been intermittent since onset. Pertinent negatives include no inability to bear weight, loss of motion, loss of sensation, muscle weakness, numbness or tingling. She reports no foreign bodies present. The symptoms are aggravated by movement. She has tried NSAIDs for the symptoms. The treatment provided significant relief.  Shortness of Breath This is a chronic problem. The current episode started more than 1 month ago. The problem occurs intermittently. The problem has been waxing and waning. Associated symptoms include wheezing. Pertinent negatives include no abdominal pain, chest pain, claudication, fever, headaches, hemoptysis, neck pain, orthopnea, PND, rhinorrhea, sore throat or vomiting. The symptoms are aggravated by exercise (she recalls walking a long distance while in [place] last week and she had to stop because  of the SOB). The patient has no known risk factors for DVT/PE. She has tried beta agonist inhalers for the symptoms. The treatment provided significant relief. Her past medical history is significant for allergies. (She has never been diagnosed wtih asthma but says the previous PCP gave her an inhaler.)   Edema: Patient complains of peripheral edema. The location of the edema is lower leg(s) bilateral, ankle(s) bilateral.  The edema has been generalized.  Onset of symptoms was a few months ago, unchanged since that time. The edema is present in the evening. The patient states a few months ago.  The swelling has been aggravated by dependency of involved  area, relieved by elevation of involved area, and been associated with shortness of breath and weight gain. Cardiac risk factors include sedentary lifestyle and family history of diabetes and hypothyroidism in her mother and sister.  The patient states that she has been told in the past, that she  Has borderline HTN and diabetes. She has never been diagnosed with either of these, although it runs in her family heavily. She does report an episode last summer when she was cutting the grass. She returned inside and had a syncopal episode where she fainted and passed out. She was aware of everything that happened when she woke up after a few seconds. This is the first time this has occurred and has never happened again. She says she didn't go to the hospital during that time for evaluation and haven't seen anyone since this happened.   PMH: OA of knees R>L, borderline HTN, borderline diabetes Meds: albuterol inhaler prn and flonase Allergies: PCN Surgeries: Right TKA Family hx: strong family hx of HTN and diabetes along with hypothyroidism.  Review of Systems  Constitutional: Negative for fever, activity change, appetite change and unexpected weight change.  HENT: Negative for congestion, ear discharge, rhinorrhea and sore throat.   Eyes: Negative for pain and visual disturbance.  Respiratory: Positive for shortness of breath and wheezing. Negative for cough and hemoptysis.        Has hx of SOB with exertion and wheezing. Has inhaler but never diagnosed with asthma.   Cardiovascular: Negative for chest pain, orthopnea, claudication and PND.  Gastrointestinal: Negative for nausea, vomiting, abdominal pain, diarrhea and  constipation.  Endocrine: Negative for cold intolerance, heat intolerance, polydipsia and polyuria.  Genitourinary: Negative for dysuria, flank pain and menstrual problem.  Musculoskeletal: Positive for gait problem and joint swelling. Negative for neck pain.       Chronic right knee  pain since total knee replacement   Skin: Negative for color change and pallor.  Allergic/Immunologic: Negative for environmental allergies and immunocompromised state.  Neurological: Positive for syncope. Negative for dizziness, tingling, numbness and headaches.       Had syncopal episode last summer after mowing the lawn, no evaluation was done at that time.   Hematological: Negative for adenopathy. Does not bruise/bleed easily.  Psychiatric/Behavioral: Negative for confusion and agitation.       Objective:   Physical Exam  Nursing note and vitals reviewed. Constitutional: She is oriented to person, place, and time. She appears well-developed and well-nourished.  HENT:  Head: Normocephalic and atraumatic.  Right Ear: External ear normal.  Left Ear: External ear normal.  Nose: Nose normal.  Mouth/Throat: Oropharynx is clear and moist.  Eyes: Conjunctivae are normal. Pupils are equal, round, and reactive to light.  Neck: Normal range of motion. No thyromegaly present.  Cardiovascular: Normal rate, regular rhythm, normal heart sounds and intact distal pulses.   Pulmonary/Chest: Effort normal and breath sounds normal. No respiratory distress. She has no wheezes.  Abdominal: Soft. Bowel sounds are normal.  Musculoskeletal: Normal range of motion. She exhibits edema.  Trace peripheral edema to bilateral ankles  Neurological: She is alert and oriented to person, place, and time.  Skin: Skin is warm and dry.  Vertical incisional scar to anterior right knee. No warmth or effusion noted, Full ROM with flexion and extension.  Psychiatric: She has a normal mood and affect. Her behavior is normal. Thought content normal.       Assessment:     Nancy [name] was seen today for medication refill, cough, knee pain and shortness of breath.  Diagnoses and associated orders for this visit:  Right knee pain - Diclofenac Sodium 3 % GEL; Apply to knee TID as needed  SOB (shortness of  breath) - albuterol (PROVENTIL HFA;VENTOLIN HFA) 108 (90 BASE) MCG/ACT inhaler; Inhale 2 puffs into the lungs every 6 (six) hours as needed. - Pro b natriuretic peptide; Future - Pro b natriuretic peptide  Borderline blood pressure - Lipid Panel; Future - Lipid Panel  Peripheral edema - TSH; Future - T4, free; Future - TSH - T4, free - Pro b natriuretic peptide; Future - Pro b natriuretic peptide  Other malaise and fatigue - CBC with Differential; Future - CBC with Differential - Pro b natriuretic peptide; Future - Pro b natriuretic peptide  Hyperglycemia - Hemoglobin A1c; Future - Lipid Panel; Future - Hemoglobin A1c - Lipid Panel  Preventive measure - Lipid Panel; Future - Lipid Panel - MM DIGITAL SCREENING BILATERAL; Future - Ambulatory referral to Gastroenterology - MM DIGITAL SCREENING BILATERAL  Prediabetes  Allergic rhinitis  Other Orders - fluticasone (FLONASE) 50 MCG/ACT nasal spray; Place 2 sprays into both nostrils daily.       Plan:     To get baseline labs to check for diabetes, thyroid issues, lipid panel and getting BNP due to SOB and edema to rule out any CHF.  To follow up with results.  Refer to GI for colonoscopy for screening  Refer for mammogram.   For now, have refilled albuterol inhaler but with SOB with exertion associated with some bilateral leg swelling, would like to rule out  CHF as the cause of the SOB. Also refilled her flonase for allergies.   Blood pressure borderline and right at goal today according to JNC 8 and will need to monitor this at home for better accurate readings.   Explained about knee pain and hx of knee replacement, will need to monitor weight and any weight gain can cause more stress to that knee. To avoid excessive NSAID use, have sent in diclofenac gel to be used topically prn TID.

## 2014-03-11 NOTE — Patient Instructions (Signed)
Edema Edema is an abnormal build-up of fluids in tissues. Because this is partly dependent on gravity (water flows to the lowest place), it is more common in the legs and thighs (lower extremities). It is also common in the looser tissues, like around the eyes. Painless swelling of the feet and ankles is common and increases as a person ages. It may affect both legs and may include the calves or even thighs. When squeezed, the fluid may move out of the affected area and may leave a dent for a few moments. CAUSES   Prolonged standing or sitting in one place for extended periods of time. Movement helps pump tissue fluid into the veins, and absence of movement prevents this, resulting in edema.  Varicose veins. The valves in the veins do not work as well as they should. This causes fluid to leak into the tissues.  Fluid and salt overload.  Injury, burn, or surgery to the leg, ankle, or foot, may damage veins and allow fluid to leak out.  Sunburn damages vessels. Leaky vessels allow fluid to go out into the sunburned tissues.  Allergies (from insect bites or stings, medications or chemicals) cause swelling by allowing vessels to become leaky.  Protein in the blood helps keep fluid in your vessels. Low protein, as in malnutrition, allows fluid to leak out.  Hormonal changes, including pregnancy and menstruation, cause fluid retention. This fluid may leak out of vessels and cause edema.  Medications that cause fluid retention. Examples are sex hormones, blood pressure medications, steroid treatment, or anti-depressants.  Some illnesses cause edema, especially heart failure, kidney disease, or liver disease.  Surgery that cuts veins or lymph nodes, such as surgery done for the heart or for breast cancer, may result in edema. DIAGNOSIS  Your caregiver is usually easily able to determine what is causing your swelling (edema) by simply asking what is wrong (getting a history) and examining you (doing  a physical). Sometimes x-rays, EKG (electrocardiogram or heart tracing), and blood work may be done to evaluate for underlying medical illness. TREATMENT  General treatment includes:  Leg elevation (or elevation of the affected body part).  Restriction of fluid intake.  Prevention of fluid overload.  Compression of the affected body part. Compression with elastic bandages or support stockings squeezes the tissues, preventing fluid from entering and forcing it back into the blood vessels.  Diuretics (also called water pills or fluid pills) pull fluid out of your body in the form of increased urination. These are effective in reducing the swelling, but can have side effects and must be used only under your caregiver's supervision. Diuretics are appropriate only for some types of edema. The specific treatment can be directed at any underlying causes discovered. Heart, liver, or kidney disease should be treated appropriately. HOME CARE INSTRUCTIONS   Elevate the legs (or affected body part) above the level of the heart, while lying down.  Avoid sitting or standing still for prolonged periods of time.  Avoid putting anything directly under the knees when lying down, and do not wear constricting clothing or garters on the upper legs.  Exercising the legs causes the fluid to work back into the veins and lymphatic channels. This may help the swelling go down.  The pressure applied by elastic bandages or support stockings can help reduce ankle swelling.  A low-salt diet may help reduce fluid retention and decrease the ankle swelling.  Take any medications exactly as prescribed. SEEK MEDICAL CARE IF:  Your edema is   not responding to recommended treatments. SEEK IMMEDIATE MEDICAL CARE IF:   You develop shortness of breath or chest pain.  You cannot breathe when you lay down; or if, while lying down, you have to get up and go to the window to get your breath.  You are having increasing  swelling without relief from treatment.  You develop a fever over 102 F (38.9 C).  You develop pain or redness in the areas that are swollen.  Tell your caregiver right away if you have gained 03 lb/1.4 kg in 1 day or 05 lb/2.3 kg in a week. MAKE SURE YOU:   Understand these instructions.  Will watch your condition.  Will get help right away if you are not doing well or get worse. Document Released: 11/26/2005 Document Revised: 05/27/2012 Document Reviewed: 07/14/2008 Exit[employer] Patient Information 2014 [employer].  

## 2014-03-12 LAB — CBC WITH DIFFERENTIAL/PLATELET
BASOS ABS: 0 10*3/uL (ref 0.0–0.1)
Basophils Relative: 0 % (ref 0–1)
EOS ABS: 0.2 10*3/uL (ref 0.0–0.7)
EOS PCT: 4 % (ref 0–5)
HCT: 36.7 % (ref 36.0–46.0)
Hemoglobin: 12.7 g/dL (ref 12.0–15.0)
Lymphocytes Relative: 38 % (ref 12–46)
Lymphs Abs: 1.9 10*3/uL (ref 0.7–4.0)
MCH: 26.1 pg (ref 26.0–34.0)
MCHC: 34.6 g/dL (ref 30.0–36.0)
MCV: 75.5 fL — AB (ref 78.0–100.0)
Monocytes Absolute: 0.5 10*3/uL (ref 0.1–1.0)
Monocytes Relative: 10 % (ref 3–12)
NEUTROS PCT: 48 % (ref 43–77)
Neutro Abs: 2.4 10*3/uL (ref 1.7–7.7)
PLATELETS: 232 10*3/uL (ref 150–400)
RBC: 4.86 MIL/uL (ref 3.87–5.11)
RDW: 16.4 % — AB (ref 11.5–15.5)
WBC: 4.9 10*3/uL (ref 4.0–10.5)

## 2014-03-12 LAB — T4, FREE: Free T4: 1.12 ng/dL (ref 0.80–1.80)

## 2014-03-12 LAB — LIPID PANEL
Cholesterol: 143 mg/dL (ref 0–200)
HDL: 33 mg/dL — ABNORMAL LOW (ref >39)
LDL CALC: 90 mg/dL (ref 0–99)
Total CHOL/HDL Ratio: 4.3 Ratio
Triglycerides: 98 mg/dL (ref <150)
VLDL: 20 mg/dL (ref 0–40)

## 2014-03-12 LAB — HEMOGLOBIN A1C
Hgb A1c MFr Bld: 7.3 % — ABNORMAL HIGH (ref <5.7)
Mean Plasma Glucose: 163 mg/dL — ABNORMAL HIGH (ref <117)

## 2014-03-12 LAB — TSH: TSH: 3.509 u[IU]/mL (ref 0.350–4.500)

## 2014-03-13 LAB — PRO B NATRIURETIC PEPTIDE: PRO B NATRI PEPTIDE: 36.8 pg/mL (ref <126)

## 2014-03-15 ENCOUNTER — Telehealth: Payer: Self-pay | Admitting: Family Medicine

## 2014-03-15 NOTE — Telephone Encounter (Signed)
Attempted to contact patient regarding lab results and Hgb A1c at 7.3% with the need to start Metformin BID. No answer. Will attempt to contact the patient later today.

## 2014-03-19 ENCOUNTER — Encounter: Payer: Self-pay | Admitting: Family Medicine

## 2014-03-19 DIAGNOSIS — E119 Type 2 diabetes mellitus without complications: Secondary | ICD-10-CM | POA: Insufficient documentation

## 2014-03-19 MED ORDER — METFORMIN HCL 500 MG PO TABS: 500.0000 mg | ORAL_TABLET | Freq: Two times a day (BID) | ORAL | Status: DC

## 2014-03-29 ENCOUNTER — Telehealth: Payer: Self-pay | Admitting: Family Medicine

## 2014-03-29 DIAGNOSIS — E119 Type 2 diabetes mellitus without complications: Secondary | ICD-10-CM

## 2014-03-29 MED ORDER — BLOOD GLUCOSE METER KIT: PACK | Status: AC

## 2014-03-29 NOTE — Telephone Encounter (Signed)
Thank you :)

## 2014-03-29 NOTE — Addendum Note (Signed)
Addended by: Kela MillinBARRINO, Aarohi Redditt Y on: 03/29/2014 02:01 PM   Modules accepted: Orders

## 2014-03-29 NOTE — Telephone Encounter (Signed)
Yes please inform patient that I sent in kit to [facility]. Thanks.

## 2014-03-29 NOTE — Telephone Encounter (Signed)
Patient was informed of test results today while in office with daughter. She needs to know if you were going to write a script for the meter and test strips or anything else needed to check her blood sugar. She has [employer]BS and she stated that her pharmacy told her with a script it would be much more affordable. Please advise what patient needs to do. [phone] is patient number.

## 2014-03-31 ENCOUNTER — Telehealth: Payer: Self-pay | Admitting: *Deleted

## 2014-03-31 MED ORDER — GLIPIZIDE 5 MG PO TABS: 5.0000 mg | ORAL_TABLET | Freq: Every day | ORAL | Status: DC

## 2014-03-31 NOTE — Addendum Note (Signed)
Addended by: Kela MillinBARRINO, ALETHEA Y on: 03/31/2014 02:15 PM   Modules accepted: Orders

## 2014-03-31 NOTE — Telephone Encounter (Signed)
Pt called and left VM stating that she was seen in office and that MD started her on Metformin and that she took medication on 03/19/2014 and 03/20/2014 and that she thought she had an allergic reaction. Patient continued to state that her eyes were swollen and that she was very tired and sleepy until yesterday. She stated that she did not take medication last night and that she checked her blood sugar this morning and it was 123. She wants to know what to do. Nurse verified pt was seen in office on 03/11/2014 and received results lab results on 03/19/2014. Will route

## 2014-03-31 NOTE — Telephone Encounter (Signed)
She can stop taking Metformin since she didn't tolerate it and I've sent in Glipizide. One tab daily with a meal. Please inform patient.

## 2014-03-31 NOTE — Telephone Encounter (Signed)
Patient notified

## 2014-04-22 NOTE — Progress Notes (Signed)
This encounter was created in error - please disregard.

## 2014-05-05 ENCOUNTER — Telehealth: Payer: Self-pay

## 2014-05-05 NOTE — Telephone Encounter (Signed)
Pt was referred by Lita Mains for screening colonoscopy. Has not responded to letter. This Phone number is not working. Will mail another letter to call for screening colonoscopy.

## 2015-01-30 ENCOUNTER — Encounter (HOSPITAL_COMMUNITY): Payer: Self-pay | Admitting: Emergency Medicine

## 2015-01-30 ENCOUNTER — Emergency Department (HOSPITAL_COMMUNITY)
Admission: EM | Admit: 2015-01-30 | Discharge: 2015-01-30 | Disposition: A | Discharge status: Home / Self Care | Payer: BC Managed Care – PPO | Attending: Emergency Medicine | Admitting: Emergency Medicine

## 2015-01-30 ENCOUNTER — Emergency Department (HOSPITAL_COMMUNITY): Payer: BC Managed Care – PPO

## 2015-01-30 DIAGNOSIS — R112 Nausea with vomiting, unspecified: Secondary | ICD-10-CM | POA: Insufficient documentation

## 2015-01-30 DIAGNOSIS — Z7951 Long term (current) use of inhaled steroids: Secondary | ICD-10-CM | POA: Diagnosis not present

## 2015-01-30 DIAGNOSIS — Z79899 Other long term (current) drug therapy: Secondary | ICD-10-CM | POA: Diagnosis not present

## 2015-01-30 DIAGNOSIS — Z880 Allergy status to penicillin: Secondary | ICD-10-CM | POA: Diagnosis not present

## 2015-01-30 DIAGNOSIS — R197 Diarrhea, unspecified: Secondary | ICD-10-CM | POA: Insufficient documentation

## 2015-01-30 DIAGNOSIS — J45909 Unspecified asthma, uncomplicated: Secondary | ICD-10-CM | POA: Insufficient documentation

## 2015-01-30 DIAGNOSIS — R1013 Epigastric pain: Secondary | ICD-10-CM | POA: Diagnosis not present

## 2015-01-30 DIAGNOSIS — Z3202 Encounter for pregnancy test, result negative: Secondary | ICD-10-CM | POA: Insufficient documentation

## 2015-01-30 DIAGNOSIS — R1110 Vomiting, unspecified: Secondary | ICD-10-CM

## 2015-01-30 DIAGNOSIS — R52 Pain, unspecified: Secondary | ICD-10-CM

## 2015-01-30 LAB — URINE MICROSCOPIC-ADD ON

## 2015-01-30 LAB — CBC WITH DIFFERENTIAL/PLATELET
Basophils Absolute: 0 10*3/uL (ref 0.0–0.1)
Basophils Relative: 0 % (ref 0–1)
EOS ABS: 0 10*3/uL (ref 0.0–0.7)
Eosinophils Relative: 0 % (ref 0–5)
HCT: 40.6 % (ref 36.0–46.0)
Hemoglobin: 13.7 g/dL (ref 12.0–15.0)
Lymphocytes Relative: 7 % — ABNORMAL LOW (ref 12–46)
Lymphs Abs: 0.7 10*3/uL (ref 0.7–4.0)
MCH: 26.8 pg (ref 26.0–34.0)
MCHC: 33.7 g/dL (ref 30.0–36.0)
MCV: 79.3 fL (ref 78.0–100.0)
MONO ABS: 0.5 10*3/uL (ref 0.1–1.0)
MONOS PCT: 5 % (ref 3–12)
NEUTROS ABS: 9.2 10*3/uL — AB (ref 1.7–7.7)
Neutrophils Relative %: 88 % — ABNORMAL HIGH (ref 43–77)
Platelets: 229 10*3/uL (ref 150–400)
RBC: 5.12 MIL/uL — ABNORMAL HIGH (ref 3.87–5.11)
RDW: 15.7 % — ABNORMAL HIGH (ref 11.5–15.5)
WBC: 10.5 10*3/uL (ref 4.0–10.5)

## 2015-01-30 LAB — BASIC METABOLIC PANEL
ANION GAP: 8 (ref 5–15)
BUN: 20 mg/dL (ref 6–23)
CALCIUM: 9.9 mg/dL (ref 8.4–10.5)
CHLORIDE: 108 mmol/L (ref 96–112)
CO2: 26 mmol/L (ref 19–32)
Creatinine, Ser: 0.89 mg/dL (ref 0.50–1.10)
GFR calc Af Amer: 84 mL/min — ABNORMAL LOW (ref >90)
GFR, EST NON AFRICAN AMERICAN: 73 mL/min — AB (ref >90)
GLUCOSE: 105 mg/dL — AB (ref 70–99)
Potassium: 3.8 mmol/L (ref 3.5–5.1)
Sodium: 142 mmol/L (ref 135–145)

## 2015-01-30 LAB — URINALYSIS, ROUTINE W REFLEX MICROSCOPIC
Bilirubin Urine: NEGATIVE
GLUCOSE, UA: NEGATIVE mg/dL
HGB URINE DIPSTICK: NEGATIVE
KETONES UR: NEGATIVE mg/dL
NITRITE: NEGATIVE
PH: 6 (ref 5.0–8.0)
Protein, ur: NEGATIVE mg/dL
Specific Gravity, Urine: 1.01 (ref 1.005–1.030)
Urobilinogen, UA: 0.2 mg/dL (ref 0.0–1.0)

## 2015-01-30 LAB — HEPATIC FUNCTION PANEL
ALT: 28 U/L (ref 0–35)
AST: 19 U/L (ref 0–37)
Albumin: 4.5 g/dL (ref 3.5–5.2)
Alkaline Phosphatase: 66 U/L (ref 39–117)
Bilirubin, Direct: <0.1 mg/dL (ref 0.0–0.5)
TOTAL PROTEIN: 8.3 g/dL (ref 6.0–8.3)
Total Bilirubin: 0.8 mg/dL (ref 0.3–1.2)

## 2015-01-30 LAB — PREGNANCY, URINE: Preg Test, Ur: NEGATIVE

## 2015-01-30 LAB — LIPASE, BLOOD: Lipase: 31 U/L (ref 11–59)

## 2015-01-30 MED ORDER — SODIUM CHLORIDE 0.9 % IV BOLUS (SEPSIS)
1000.0000 mL | Freq: Once | INTRAVENOUS | Status: AC
  Administered 2015-01-30: 1000 mL via INTRAVENOUS

## 2015-01-30 MED ORDER — ONDANSETRON 4 MG PO TBDP: ORAL_TABLET | ORAL | Status: DC

## 2015-01-30 MED ORDER — ONDANSETRON HCL 4 MG/2ML IJ SOLN
4.0000 mg | Freq: Once | INTRAMUSCULAR | Status: AC
  Administered 2015-01-30: 4 mg via INTRAVENOUS
  Filled 2015-01-30: qty 2

## 2015-01-30 MED ORDER — KETOROLAC TROMETHAMINE 30 MG/ML IJ SOLN
30.0000 mg | Freq: Once | INTRAMUSCULAR | Status: AC
  Administered 2015-01-30: 30 mg via INTRAVENOUS
  Filled 2015-01-30: qty 1

## 2015-01-30 MED ORDER — TRAMADOL HCL 50 MG PO TABS: 50.0000 mg | ORAL_TABLET | Freq: Four times a day (QID) | ORAL | Status: DC | PRN

## 2015-01-30 MED ORDER — PANTOPRAZOLE SODIUM 40 MG IV SOLR
40.0000 mg | Freq: Once | INTRAVENOUS | Status: AC
  Administered 2015-01-30: 40 mg via INTRAVENOUS
  Filled 2015-01-30: qty 40

## 2015-01-30 NOTE — ED Notes (Signed)
C/o nausea and vomiting.  Vomited once in last 12 hours.  C/o numbness to both hand.  Glucose 97-119 at home.

## 2015-01-30 NOTE — Discharge Instructions (Signed)
Follow up with your md in 1-2 days for recheck.  Drink plenty of fluids °

## 2015-01-30 NOTE — ED Provider Notes (Signed)
CSN: [id number]     Arrival date & time 01/30/15  1453 History  This chart was scribed for Maudry Diego, MD by Dellis Filbert, ED Scribe. The patient was seen in [facility] and the patient's care was started at 3:32 PM.  Chief Complaint  Patient presents with  . Nausea  . Emesis  . Numbness   Patient is a 54 y.o. female presenting with vomiting. The history is provided by the patient. No language interpreter was used.  Emesis Severity:  Mild Duration:  12 hours Number of daily episodes:  1 Chronicity:  New Associated symptoms: abdominal pain and diarrhea   Abdominal pain:    Location:  Epigastric   Severity:  Mild   Onset quality:  Sudden   Duration:  12 hours Diarrhea:    Number of occurrences:  3   Severity:  Mild  HPI Comments: Nancy [name] is a 54 y.o. female who presents to the Emergency Department complaining of nausea, vomiting x1 diarrhea x3 and abdominal pain onset 12 hours ago. She has had sick contacts. No abdominal surgeries.  Past Medical History  Diagnosis Date  . Asthma    Past Surgical History  Procedure Laterality Date  . Right foot    . Replacement total knee    . Breast surgery     Family History  Problem Relation Age of Onset  . Anesthesia problems      family history   . Diabetes      family history    History  Substance Use Topics  . Smoking status: Never Smoker   . Smokeless tobacco: Not on file  . Alcohol Use: Not on file   OB History    No data available     Review of Systems  Gastrointestinal: Positive for nausea, vomiting, abdominal pain and diarrhea.  All other systems reviewed and are negative.   Allergies  Metformin and related; Penicillins; Strawberry; and Watermelon flavor  Home Medications   Prior to Admission medications   Medication Sig Start Date End Date Taking? Authorizing Provider  albuterol (PROVENTIL HFA;VENTOLIN HFA) 108 (90 BASE) MCG/ACT inhaler Inhale 2 puffs into the lungs every 6 (six) hours as  needed. 03/11/14   Sandi Mealy, MD  Blood Glucose Monitoring Suppl (BLOOD GLUCOSE METER) kit Use as instructed, check sugars one time a day at least every other day. 03/29/14   Sandi Mealy, MD  Diclofenac Sodium 3 % GEL Apply to knee TID as needed 03/11/14   Sandi Mealy, MD  fluticasone (FLONASE) 50 MCG/ACT nasal spray Place 2 sprays into both nostrils daily. 03/11/14   Sandi Mealy, MD  glipiZIDE (GLUCOTROL) 5 MG tablet Take 1 tablet (5 mg total) by mouth daily before breakfast. 03/31/14   Sandi Mealy, MD  metFORMIN (GLUCOPHAGE) 500 MG tablet Take 1 tablet (500 mg total) by mouth 2 (two) times daily with a meal. 03/19/14   Sandi Mealy, MD   BP 110/58 mmHg  Pulse 88  Temp(Src) 99 F (37.2 C) (Oral)  Resp 18  Ht _0  (1.626 m)  Wt 204 lb (92.534 kg)  BMI 35.00 kg/m2  SpO2 100% Physical Exam  Constitutional: She is oriented to person, place, and time. She appears well-developed.  HENT:  Head: Normocephalic.  Eyes: Conjunctivae and EOM are normal. No scleral icterus.  Neck: Neck supple. No thyromegaly present.  Cardiovascular: Normal rate and regular rhythm.  Exam reveals no gallop and no friction rub.   No  murmur heard. Pulmonary/Chest: No stridor. She has no wheezes. She has no rales. She exhibits no tenderness.  Abdominal: She exhibits no distension. There is tenderness. There is no rebound.  Minor epigastric tenderness.  Musculoskeletal: Normal range of motion. She exhibits no edema.  Lymphadenopathy:    She has no cervical adenopathy.  Neurological: She is oriented to person, place, and time. She exhibits normal muscle tone. Coordination normal.  Skin: No rash noted. No erythema.  Psychiatric: She has a normal mood and affect. Her behavior is normal.    ED Course  Procedures  DIAGNOSTIC STUDIES: Oxygen Saturation is 100% on room air, normal by my interpretation.    COORDINATION OF CARE: 3:35 PM Discussed treatment plan with pt at bedside and pt  agreed to plan.  Labs Review Labs Reviewed  CBC WITH DIFFERENTIAL/PLATELET - Abnormal; Notable for the following:    RBC 5.12 (*)    RDW 15.7 (*)    Neutrophils Relative % 88 (*)    Neutro Abs 9.2 (*)    Lymphocytes Relative 7 (*)    All other components within normal limits  BASIC METABOLIC PANEL  URINALYSIS, ROUTINE W REFLEX MICROSCOPIC   Imaging Review No results found.   EKG Interpretation None      MDM   Final diagnoses:  None    Vomiting.  Nl studies,  Will tx with zofran and ultram and close follow up with pcp   The chart was scribed for me under my direct supervision.  I personally performed the history, physical, and medical decision making and all procedures in the evaluation of this patient.Maudry Diego, MD 01/30/15 [id number]

## 2016-10-08 ENCOUNTER — Encounter (HOSPITAL_COMMUNITY): Payer: Self-pay | Admitting: Emergency Medicine

## 2016-10-08 ENCOUNTER — Emergency Department (HOSPITAL_COMMUNITY): Payer: BC Managed Care – PPO

## 2016-10-08 ENCOUNTER — Emergency Department (HOSPITAL_COMMUNITY)
Admission: EM | Admit: 2016-10-08 | Discharge: 2016-10-08 | Disposition: A | Discharge status: Home / Self Care | Payer: BC Managed Care – PPO | Attending: Emergency Medicine | Admitting: Emergency Medicine

## 2016-10-08 DIAGNOSIS — E119 Type 2 diabetes mellitus without complications: Secondary | ICD-10-CM | POA: Diagnosis not present

## 2016-10-08 DIAGNOSIS — Z7984 Long term (current) use of oral hypoglycemic drugs: Secondary | ICD-10-CM | POA: Insufficient documentation

## 2016-10-08 DIAGNOSIS — J45909 Unspecified asthma, uncomplicated: Secondary | ICD-10-CM | POA: Diagnosis not present

## 2016-10-08 DIAGNOSIS — J069 Acute upper respiratory infection, unspecified: Secondary | ICD-10-CM | POA: Insufficient documentation

## 2016-10-08 DIAGNOSIS — R197 Diarrhea, unspecified: Secondary | ICD-10-CM | POA: Diagnosis not present

## 2016-10-08 DIAGNOSIS — Z79899 Other long term (current) drug therapy: Secondary | ICD-10-CM | POA: Insufficient documentation

## 2016-10-08 DIAGNOSIS — I10 Essential (primary) hypertension: Secondary | ICD-10-CM | POA: Insufficient documentation

## 2016-10-08 DIAGNOSIS — R05 Cough: Secondary | ICD-10-CM | POA: Diagnosis present

## 2016-10-08 DIAGNOSIS — B9789 Other viral agents as the cause of diseases classified elsewhere: Secondary | ICD-10-CM

## 2016-10-08 HISTORY — DX: Type 2 diabetes mellitus without complications: E11.9

## 2016-10-08 HISTORY — DX: Essential (primary) hypertension: I10

## 2016-10-08 MED ORDER — HYDROCODONE-HOMATROPINE 5-1.5 MG/5ML PO SYRP: 5.0000 mL | ORAL_SOLUTION | Freq: Four times a day (QID) | ORAL | 0 refills | Status: DC | PRN

## 2016-10-08 NOTE — ED Triage Notes (Signed)
Patient complaining of cough and hoarseness x 1 week. States she is coughing yellow sputum.

## 2016-10-08 NOTE — Discharge Instructions (Signed)
Your vital signs within normal limits. Your chest x-ray is negative for pneumonia or acute problems. Please increase fluids. Please rest her voice is much as possible. Fresh lemon in warm water may also be helpful.

## 2016-10-08 NOTE — ED Provider Notes (Signed)
[facility] DEPT Provider Note   CSN: [id number] Arrival date & time: 10/08/16  1050  By signing my name below, I, Rayna Sexton, attest that this documentation has been prepared under the direction and in the presence of Lily Kocher, PA-C. Electronically Signed: Rayna Sexton, ED Scribe. 10/08/16. 12:53 PM.   History   Chief Complaint Chief Complaint  Patient presents with  . Cough    HPI HPI Comments: Nancy [name] is a 55 y.o. female with a h/o asthma who presents to the Emergency Department complaining of constant, moderate, productive cough x 1 week. She reports an associated fever (TMAX 99.6 last week) which has since alleviated as well as increase in frequency of loose stools and voice hoarseness. Pt confirms her listed medical history including DM. Pt works in a daycare. She denies hemoptysis, nasal congestion, sneezing and vomiting.  The history is provided by the patient. No language interpreter was used.  Cough  This is a new problem. The current episode started more than 2 days ago. The problem occurs constantly. The problem has not changed since onset.The cough is productive of sputum. There has been no fever. Pertinent negatives include no chills.   Past Medical History:  Diagnosis Date  . Asthma   . Diabetes mellitus without complication ([place])   . Hypertension     Patient Active Problem List   Diagnosis Date Noted  . Type 2 diabetes mellitus ([place]) 03/19/2014  . Right knee pain 03/11/2014  . SOB (shortness of breath) 03/11/2014  . Borderline blood pressure 03/11/2014  . Prediabetes 03/11/2014  . Preventive measure 03/11/2014  . Hyperglycemia 03/11/2014  . Other malaise and fatigue 03/11/2014  . Peripheral edema 03/11/2014  . Allergic rhinitis 03/11/2014  . 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 LOWER LEG, ARTHRITIS, DEGEN./OSTEO 04/06/2008    Past Surgical History:  Procedure Laterality Date  . BREAST SURGERY    . REPLACEMENT TOTAL KNEE    . right  foot      OB History    No data available       Home Medications    Prior to Admission medications   Medication Sig Start Date End Date Taking? Authorizing Provider  albuterol (PROVENTIL HFA;VENTOLIN HFA) 108 (90 BASE) MCG/ACT inhaler Inhale 2 puffs into the lungs every 6 (six) hours as needed. 03/11/14   Sandi Mealy, MD  Blood Glucose Monitoring Suppl (BLOOD GLUCOSE METER) kit Use as instructed, check sugars one time a day at least every other day. 03/29/14   Sandi Mealy, MD  Diclofenac Sodium 3 % GEL Apply to knee TID as needed Patient taking differently: Apply 1 application topically 3 (three) times daily as needed (Applied to knee as needed for pain).  03/11/14   Sandi Mealy, MD  fluticasone (FLONASE) 50 MCG/ACT nasal spray Place 2 sprays into both nostrils daily. 03/11/14   Sandi Mealy, MD  glipiZIDE (GLUCOTROL) 5 MG tablet Take 1 tablet (5 mg total) by mouth daily before breakfast. 03/31/14   Sandi Mealy, MD  lisinopril (PRINIVIL,ZESTRIL) 2.5 MG tablet Take 2.5 mg by mouth daily.    Historical Provider, MD  metFORMIN (GLUCOPHAGE) 500 MG tablet Take 1 tablet (500 mg total) by mouth 2 (two) times daily with a meal. Patient not taking: Reported on 01/30/2015 03/19/14   Sandi Mealy, MD  ondansetron (ZOFRAN ODT) 4 MG disintegrating tablet 36m ODT q4 hours prn nausea/vomit 01/30/15   JMilton Ferguson MD  pravastatin (PRAVACHOL) 10 MG tablet Take 10 mg  by mouth daily. 11/19/14   Historical Provider, MD  traMADol (ULTRAM) 50 MG tablet Take 1 tablet (50 mg total) by mouth every 6 (six) hours as needed. 01/30/15   Milton Ferguson, MD    Family History Family History  Problem Relation Age of Onset  . Anesthesia problems      family history   . Diabetes      family history     Social History Social History  Substance Use Topics  . Smoking status: Never Smoker  . Smokeless tobacco: Never Used  . Alcohol use No     Allergies   Strawberry extract; Watermelon  flavor; Metformin and related; and Penicillins   Review of Systems Review of Systems  Constitutional: Negative for chills and fever.  HENT: Positive for voice change. Negative for congestion, sneezing and trouble swallowing.   Respiratory: Positive for cough.   Gastrointestinal: Positive for diarrhea (loose stools). Negative for constipation and vomiting.   Physical Exam Updated Vital Signs BP 140/65 (BP Location: Left Arm)   Pulse 69   Temp 97.8 F (36.6 C) (Oral)   Resp 20   Ht 5' 4"  (1.626 m)   Wt 215 lb (97.5 kg)   SpO2 100%   BMI 36.90 kg/m   Physical Exam  Constitutional: She is oriented to person, place, and time. She appears well-developed and well-nourished.  HENT:  Head: Normocephalic and atraumatic.  Nose: Rhinorrhea present.  Mouth/Throat: Oropharynx is clear and moist. Uvula swelling present. No oropharyngeal exudate, posterior oropharyngeal edema, posterior oropharyngeal erythema or tonsillar abscesses.  Uvula slightly enlarged. Mild nasal congestion present.   Eyes: EOM are normal.  Neck: Normal range of motion. Neck supple.  No nuchal rigidity.   Cardiovascular: Normal rate, regular rhythm and normal heart sounds.  Exam reveals no gallop and no friction rub.   No murmur heard. Pulmonary/Chest: Effort normal and breath sounds normal. No respiratory distress. She has no wheezes. She has no rales.  Symmetrical rise and fall of the chest.   Abdominal: Soft.  Musculoskeletal: Normal range of motion.  Lymphadenopathy:    She has no cervical adenopathy.  Neurological: She is alert and oriented to person, place, and time.  Skin: Skin is warm and dry.  No rash of the palms.   Psychiatric: She has a normal mood and affect.  Nursing note and vitals reviewed.  ED Treatments / Results  Labs (all labs ordered are listed, but only abnormal results are displayed) Labs Reviewed - No data to display  EKG  EKG Interpretation None       Radiology Dg Chest 2  View  Result Date: 10/08/2016 CLINICAL DATA:  Cough and hoarseness for 1 week, fever last week, history asthma, hypertension, diabetes mellitus EXAM: CHEST  2 VIEW COMPARISON:  01/30/2015 FINDINGS: Normal heart size, mediastinal contours, and pulmonary vascularity. Lungs clear. No pulmonary infiltrate, pleural effusion or pneumothorax. Scattered endplate spur formation thoracic spine. IMPRESSION: No acute abnormalities. Electronically Signed   By: Lavonia Dana M.D.   On: 10/08/2016 11:20    Procedures Procedures  DIAGNOSTIC STUDIES: Oxygen Saturation is 100% on RA, normal by my interpretation.    COORDINATION OF CARE: 12:51 PM Discussed next steps with pt. Pt verbalized understanding and is agreeable with the plan.    Medications Ordered in ED Medications - No data to display   Initial Impression / Assessment and Plan / ED Course  I have reviewed the triage vital signs and the nursing notes.  Pertinent labs & imaging  results that were available during my care of the patient were reviewed by me and considered in my medical decision making (see chart for details).  Clinical Course    *I have reviewed nursing notes, vital signs, and all appropriate lab and imaging results for this patient.**  **I personally performed the services described in this documentation, which was scribed in my presence. The recorded information has been reviewed and is accurate.* Final Clinical Impressions(s) / ED Diagnoses  The chest x-ray is negative for acute problem. Vital signs within normal limits. The patient has a hoarseness in her speech. 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 The patient will use Tylenol or ibuprofen for fever. I've asked her to increase fluids. To use fresh lemon in a warm tea for assistance with her hoarseness. Prescription given for Hycodan to assist with cough. Patient is to follow with primary physician or return to the emergency  department if any changes or problems.    Final diagnoses:  Viral URI with cough    New Prescriptions New Prescriptions   No medications on file     Lily Kocher, PA-C 10/08/16 1311    Milton Ferguson, MD 10/08/16 1536

## 2018-05-10 ENCOUNTER — Emergency Department (HOSPITAL_COMMUNITY)
Admission: EM | Admit: 2018-05-10 | Discharge: 2018-05-11 | Disposition: A | Discharge status: Home / Self Care | Payer: BC Managed Care – PPO | Attending: Emergency Medicine | Admitting: Emergency Medicine

## 2018-05-10 DIAGNOSIS — Z79899 Other long term (current) drug therapy: Secondary | ICD-10-CM | POA: Diagnosis not present

## 2018-05-10 DIAGNOSIS — I10 Essential (primary) hypertension: Secondary | ICD-10-CM | POA: Insufficient documentation

## 2018-05-10 DIAGNOSIS — Z7984 Long term (current) use of oral hypoglycemic drugs: Secondary | ICD-10-CM | POA: Diagnosis not present

## 2018-05-10 DIAGNOSIS — X58XXXA Exposure to other specified factors, initial encounter: Secondary | ICD-10-CM | POA: Diagnosis not present

## 2018-05-10 DIAGNOSIS — M25511 Pain in right shoulder: Secondary | ICD-10-CM | POA: Diagnosis not present

## 2018-05-10 DIAGNOSIS — Y999 Unspecified external cause status: Secondary | ICD-10-CM | POA: Diagnosis not present

## 2018-05-10 DIAGNOSIS — E119 Type 2 diabetes mellitus without complications: Secondary | ICD-10-CM | POA: Insufficient documentation

## 2018-05-10 DIAGNOSIS — Y9389 Activity, other specified: Secondary | ICD-10-CM | POA: Insufficient documentation

## 2018-05-10 DIAGNOSIS — S4991XA Unspecified injury of right shoulder and upper arm, initial encounter: Secondary | ICD-10-CM | POA: Diagnosis present

## 2018-05-10 DIAGNOSIS — J45998 Other asthma: Secondary | ICD-10-CM | POA: Insufficient documentation

## 2018-05-10 DIAGNOSIS — Y9289 Other specified places as the place of occurrence of the external cause: Secondary | ICD-10-CM | POA: Insufficient documentation
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
## 2018-05-11 ENCOUNTER — Emergency Department (HOSPITAL_COMMUNITY): Payer: BC Managed Care – PPO

## 2018-05-11 ENCOUNTER — Encounter (HOSPITAL_COMMUNITY): Payer: Self-pay

## 2018-05-11 ENCOUNTER — Other Ambulatory Visit: Payer: Self-pay

## 2018-05-11 MED ORDER — METHOCARBAMOL 500 MG PO TABS: ORAL_TABLET | ORAL | 0 refills | Status: DC

## 2018-05-11 MED ORDER — NAPROXEN 250 MG PO TABS: ORAL_TABLET | ORAL | 0 refills | Status: DC

## 2018-05-11 MED ORDER — KETOROLAC TROMETHAMINE 30 MG/ML IJ SOLN
30.0000 mg | Freq: Once | INTRAMUSCULAR | Status: AC
  Administered 2018-05-11: 30 mg via INTRAMUSCULAR
  Filled 2018-05-11: qty 1
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
## 2018-05-11 NOTE — Discharge Instructions (Addendum)
Use ice packs for comfort. Wear the sling for comfort, but not all the time. Take the medication as prescribed. Have Dr Starling MannsHarrision recheck your shoulder, call his office to get an appointment.

## 2018-05-12 ENCOUNTER — Telehealth: Payer: Self-pay | Admitting: Orthopedic Surgery

## 2018-05-12 NOTE — Telephone Encounter (Signed)
Patient called to request appointment [facility] right shoulder injury/pain yesterday, 05/11/18 - please advise based on current clinic schedule.  Patient had total knee replacement surgery by Dr Romeo AppleHarrison 06/09/10. Phone# [phone]

## 2018-05-12 NOTE — Telephone Encounter (Signed)
Okay tomorrow Tuesday, June 4 2:20 PM  No additional patients to be scheduled Wednesday make sure everybody knows and schedule new patients 20  minutes apart on Tuesday to give this time to register dictate etc. Etc.

## 2018-05-13 ENCOUNTER — Encounter: Payer: Self-pay | Admitting: Orthopedic Surgery

## 2018-05-13 ENCOUNTER — Ambulatory Visit (INDEPENDENT_AMBULATORY_CARE_PROVIDER_SITE_OTHER): Payer: BC Managed Care – PPO | Admitting: Orthopedic Surgery

## 2018-05-13 VITALS — BP 113/60 | HR 64 | Ht 64.0 in | Wt 226.0 lb

## 2018-05-13 DIAGNOSIS — M25511 Pain in right shoulder: Secondary | ICD-10-CM
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
## 2018-05-13 NOTE — Patient Instructions (Addendum)
Start home exercises     Shoulder Range of Motion Exercises Shoulder range of motion (ROM) exercises are designed to keep the shoulder moving freely. They are often recommended for people who have shoulder pain. Phase 1 exercises When you are able, do this exercise 5-6 days per week, or as told by your health care provider. Work toward doing 2 sets of 10 swings. Pendulum Exercise How To Do This Exercise Lying Down 1. Lie face-down on a bed with your abdomen close to the side of the bed. 2. Let your arm hang over the side of the bed. 3. Relax your shoulder, arm, and hand. 4. Slowly and gently swing your arm forward and back. Do not use your neck muscles to swing your arm. They should be relaxed. If you are struggling to swing your arm, have someone gently swing it for you. When you do this exercise for the first time, swing your arm at a 15 degree angle for 15 seconds, or swing your arm 10 times. As pain lessens over time, increase the angle of the swing to 30-45 degrees. 5. Repeat steps 1-4 with the other arm.  How To Do This Exercise While Standing 1. Stand next to a sturdy chair or table and hold on to it with your hand. 1. Bend forward at the waist. 2. Bend your knees slightly. 3. Relax your other arm and let it hang limp. 4. Relax the shoulder blade of the arm that is hanging and let it drop. 5. While keeping your shoulder relaxed, use body motion to swing your arm in small circles. The first time you do this exercise, swing your arm for about 30 seconds or 10 times. When you do it next time, swing your arm for a little longer. 6. Stand up tall and relax. 7. Repeat steps 1-7, this time changing the direction of the circles. 2. Repeat steps 1-8 with the other arm.  Phase 2 exercises Do these exercises 3-4 times per day on 5-6 days per week or as told by your health care provider. Work toward holding the stretch for 20 seconds. Stretching Exercise 1 1. Lift your arm straight out in  front of you. 2. Bend your arm 90 degrees at the elbow (right angle) so your forearm goes across your body and looks like the letter "L." 3. Use your other arm to gently pull the elbow forward and across your body. 4. Repeat steps 1-3 with the other arm. Stretching Exercise 2 You will need a towel or rope for this exercise. 1. Bend one arm behind your back with the palm facing outward. 2. Hold a towel with your other hand. 3. Reach the arm that holds the towel above your head, and bend that arm at the elbow. Your wrist should be behind your neck. 4. Use your free hand to grab the free end of the towel. 5. With the higher hand, gently pull the towel up behind you. 6. With the lower hand, pull the towel down behind you. 7. Repeat steps 1-6 with the other arm.  Phase 3 exercises Do each of these exercises at four different times of day (sessions) every day or as told by your health care provider. To begin with, repeat each exercise 5 times (repetitions). Work toward doing 3 sets of 12 repetitions or as told by your health care provider. Strengthening Exercise 1 You will need a light weight for this activity. As you grow stronger, you may use a heavier weight. 1. Standing with  a weight in your hand, lift your arm straight out to the side until it is at the same height as your shoulder. 2. Bend your arm at 90 degrees so that your fingers are pointing to the ceiling. 3. Slowly raise your hand until your arm is straight up in the air. 4. Repeat steps 1-3 with the other arm.  Strengthening Exercise 2 You will need a light weight for this activity. As you grow stronger, you may use a heavier weight. 1. Standing with a weight in your hand, gradually move your straight arm in an arc, starting at your side, then out in front of you, then straight up over your head. 2. Gradually move your other arm in an arc, starting at your side, then out in front of you, then straight up over your head. 3. Repeat  steps 1-2 with the other arm.  Strengthening Exercise 3 You will need an elastic band for this activity. As you grow stronger, gradually increase the size of the bands or increase the number of bands that you use at one time. 1. While standing, hold an elastic band in one hand and raise that arm up in the air. 2. With your other hand, pull down the band until that hand is by your side. 3. Repeat steps 1-2 with the other arm.  This information is not intended to replace advice given to you by your health care provider. Make sure you discuss any questions you have with your health care provider. Document Released: 08/25/2003 Document Revised: 07/22/2016 Document Reviewed: 11/22/2014 Elsevier Interactive Patient Education  2018 [employer]   OOW 2 WEEKS   CONTINUE NAPROXEN, ADD TYLENOL 500 MG Q6 PRN AS NEEDED

## 2018-05-13 NOTE — Telephone Encounter (Signed)
Called patient and scheduled appointment accordingly. °

## 2018-05-28 ENCOUNTER — Ambulatory Visit (INDEPENDENT_AMBULATORY_CARE_PROVIDER_SITE_OTHER): Payer: BC Managed Care – PPO | Admitting: Orthopedic Surgery

## 2018-05-28 ENCOUNTER — Encounter: Payer: Self-pay | Admitting: Orthopedic Surgery

## 2018-05-28 VITALS — BP 120/69 | HR 65 | Ht 64.0 in | Wt 226.0 lb

## 2018-05-28 DIAGNOSIS — M25511 Pain in right shoulder: Secondary | ICD-10-CM
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
## 2018-05-28 NOTE — Patient Instructions (Signed)
Return to work no heavy lifting for next 2 weeks

## 2019-02-09 NOTE — Progress Notes (Signed)
[facility] Note  02/11/2019     CHIEF COMPLAINT Patient presents for Retina Evaluation   HISTORY OF PRESENT ILLNESS: Nancy [name] is a 58 y.o. female who presents to the clinic today for:   HPI    Retina Evaluation    In right eye.  This started 1 week ago.  Duration of 1 week.  Associated Symptoms Floaters.  Context:  distance vision, mid-range vision and near vision.  Treatments tried include no treatments.  I, the attending physician,  performed the HPI with the patient and updated documentation appropriately.          Comments    58 y/o female pt referred by Dr. Jorja Loa for eval of retinal holes OD.  Saw Dr. Jorja Loa 1 wk ago.  Has had a "line floater" OD for about a wk, that was sudden onset, and has been constant since.  Has also been having intermittent brief sharp pain OD, that gets better after closing her eyes for a few seconds.  Denies flashes.  VA good OU cc.  No gtts.  BS 129 this a.m.  A1C 7.1 1 month ago.       Last edited by Bernarda Caffey, MD on 02/14/2019 12:47 AM. (History)    pt states she saw Dr. Jorja Loa for her routine diabetic exam, she states he saw a tear in her right eye, she states she was having some sharp pains that were followed by floaters that moved when she moved her eyes, pt endorses being diabetic (takes [place]), but denies HTN  Referring physician: Madelin Headings, DO [address] [place], [place] [address]  HISTORICAL INFORMATION:   Selected notes from the MEDICAL RECORD NUMBER Referred by Dr. Madelin Headings for concern of retinal holes OD LEE: 02.26.20 (M. Cotter) [BCVA: 20/20 OU] Ocular Hx- PMH-DM, HLD, sickle cell trait    CURRENT MEDICATIONS: Current Outpatient Medications (Ophthalmic Drugs)  Medication Sig  . prednisoLONE acetate (PRED FORTE) 1 % ophthalmic suspension Place 1 drop into the right eye 4 (four) times daily for 7 days.   No current facility-administered medications for this visit.  (Ophthalmic  Drugs)   Current Outpatient Medications (Other)  Medication Sig  . albuterol (PROVENTIL HFA;VENTOLIN HFA) 108 (90 BASE) MCG/ACT inhaler Inhale 2 puffs into the lungs every 6 (six) hours as needed.  . Blood Glucose Monitoring Suppl (BLOOD GLUCOSE METER) kit Use as instructed, check sugars one time a day at least every other day.  Marland Kitchen FARXIGA 10 MG TABS tablet   . fluticasone (FLONASE) 50 MCG/ACT nasal spray fluticasone propionate 50 mcg/actuation nasal spray,suspension  . glipiZIDE (GLUCOTROL) 5 MG tablet glipizide 5 mg tablet  . Lancets (ONETOUCH DELICA PLUS [id number]) MISC USE 1 TO CHECK GLUCOSE TWICE DAILY  . lisinopril (PRINIVIL,ZESTRIL) 2.5 MG tablet lisinopril 2.5 mg tablet  . lovastatin (MEVACOR) 10 MG tablet lovastatin 10 mg tablet  . methocarbamol (ROBAXIN) 500 MG tablet Take 1 or 2 po Q 6hrs for muscle pain  . naproxen (NAPROSYN) 250 MG tablet Take 1 po BID with food prn pain  . ONE TOUCH ULTRA TEST test strip USE 1 STRIP TO CHECK GLUCOSE ONCE TO TWICE DAILY   No current facility-administered medications for this visit.  (Other)      REVIEW OF SYSTEMS: ROS    Positive for: Endocrine, Eyes   Negative for: Constitutional, Gastrointestinal, Neurological, Skin, Genitourinary, Musculoskeletal, HENT, Cardiovascular, Respiratory, Psychiatric, Allergic/Imm, Heme/Lymph   Last edited by Matthew Folks, COA on 02/11/2019  8:48 AM. (History)       ALLERGIES Allergies  Allergen Reactions  . Cantaloupe (Diagnostic) Anaphylaxis  . Strawberry Extract Anaphylaxis  . Watermelon Flavor Anaphylaxis  . Metformin And Related Other (See Comments)    Lethargic, swelling of the eyes, drowsiness  . Penicillins Hives    PAST MEDICAL HISTORY Past Medical History:  Diagnosis Date  . Asthma   . Diabetes mellitus without complication ([place])   . Hypertension    Past Surgical History:  Procedure Laterality Date  . BREAST SURGERY    . REPLACEMENT TOTAL KNEE    . right foot      FAMILY  HISTORY Family History  Problem Relation Age of Onset  . Anesthesia problems Other        family history   . Diabetes Other        family history   . Glaucoma Sister   . Glaucoma Brother     SOCIAL HISTORY Social History   Tobacco Use  . Smoking status: Never Smoker  . Smokeless tobacco: Never Used  Substance Use Topics  . Alcohol use: No  . Drug use: No         OPHTHALMIC EXAM:  Base Eye Exam    Visual Acuity (Snellen - Linear)      Right Left   Dist cc 20/25 +2 20/20 -   Dist ph cc NI    Correction:  Glasses       Tonometry (Tonopen, 8:49 AM)      Right Left   Pressure 12 15       Pupils      Dark Light Shape React APD   Right 3 2 Round Brisk None   Left 3 2 Round Brisk None       Visual Fields (Counting fingers)      Left Right    Full Full       Extraocular Movement      Right Left    Full, Ortho Full, Ortho       Neuro/Psych    Oriented x3:  Yes   Mood/Affect:  Normal       Dilation    Both eyes:  1.0% Mydriacyl, 2.5% Phenylephrine @ 8:49 AM        Slit Lamp and Fundus Exam    Slit Lamp Exam      Right Left   Lids/Lashes Dermatochalasis - upper lid, Meibomian gland dysfunction Dermatochalasis - upper lid, Meibomian gland dysfunction   Conjunctiva/Sclera mild Melanosis mild Melanosis   Cornea mild Arcus, 1+ Punctate epithelial erosions mild Arcus, 1+ Punctate epithelial erosions   Anterior Chamber Deep and quiet Deep and quiet   Iris Round and dilated, No NVI Round and dilated, No NVI   Lens 1+ Nuclear sclerosis, 1+ Cortical cataract 1+ Nuclear sclerosis, 1+ Cortical cataract   Vitreous Vitreous syneresis, Posterior vitreous detachment, mild vitreous condensations inferiorly Vitreous syneresis, Posterior vitreous detachment       Fundus Exam      Right Left   Disc +cupping, Sharp rim, inferior narrowing of rim Pink and Sharp   C/D Ratio 0.5 0.5   Macula Flat, Good foveal reflex, No heme or edema Flat, Good foveal reflex   Vessels  mild Vascular attenuation, Copper wiring mild Copper wiring, Vascular attenuation   Periphery retinal tear with surrounding SRF superiorly  pigmented lattice at 0730,  0800 and 1000 Attached, pigmented lattice at 0430, 0530-0600          Refraction  Wearing Rx      Sphere Cylinder Axis   Right -6.25 +1.50 093   Left -5.25 +1.25 090   Age:  17 yrs   Type:  SVL       Manifest Refraction      Sphere Cylinder Axis Dist VA   Right -6.00 +1.00 085 20/20-2   Left -5.25 +0.75 085 20/20          IMAGING AND PROCEDURES  Imaging and Procedures for _0 @  OCT, Retina - OU - Both Eyes       Right Eye Quality was good. Central Foveal Thickness: 279. Progression has no prior data. Findings include normal foveal contour, no SRF, no IRF.   Left Eye Quality was good. Central Foveal Thickness: 274. Progression has no prior data. Findings include normal foveal contour, no SRF, no IRF.   Notes *Images captured and stored on drive  Diagnosis / Impression:  NFP, no IRF/SRF OU  Clinical management:  See below  Abbreviations: NFP - Normal foveal profile. CME - cystoid macular edema. PED - pigment epithelial detachment. IRF - intraretinal fluid. SRF - subretinal fluid. EZ - ellipsoid zone. ERM - epiretinal membrane. ORA - outer retinal atrophy. ORT - outer retinal tubulation. SRHM - subretinal hyper-reflective material        Repair Retinal Detach, Photocoag - OD - Right Eye       LASER PROCEDURE NOTE  Procedure:  Barrier laser retinopexy using slit lamp laser, RIGHT eye   Diagnosis:   Retinal tear with surrounding SRF / focal retinal detachment, right eye                         Retinal tear at 1130                     Patches of lattice: 730, 800, 1000  Surgeon: Bernarda Caffey, MD, PhD  Anesthesia: Topical  Informed consent obtained, operative eye marked, and time out performed prior to initiation of laser.   Laser settings:  Lumenis Smart532 laser, slit lamp Lens:  [place] PRP 165 Power: 290 mW Spot size: 200 microns Duration: 30 msec  # spots: 1137  Placement of laser: Using a Mainster PRP 165 contact lens at the slit lamp, laser was placed in three confluent rows around superior retinal break (1130) with surrounding SRF/ focal retinal detachment and patches of lattice w/ atrophic holes at 0730, 0800, and 1000 oclock anterior to equator with additional rows anteriorly.  Complications: None.  Patient tolerated the procedure well and received written and verbal post-procedure care information/education.                 ASSESSMENT/PLAN:    ICD-10-CM   1. Retinal tear of right eye H33.311   2. Right retinal detachment H33.21 Repair Retinal Detach, Photocoag - OD - Right Eye  3. 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 Retinal holes, bilateral H33.323 Repair Retinal Detach, Photocoag - OD - Right Eye  6. Diabetes mellitus type 2 without retinopathy ([place]) E11.9   7. Essential hypertension I10   8. Hypertensive retinopathy of both eyes H35.033   9. Combined forms of age-related cataract of both eyes H25.813     1,2. Retinal tear w/ surrounding SRF / focal RD, OD - The incidence, risk factors, and natural history of retinal tear  was discussed with patient.   - Potential treatment options including laser retinopexy and cryotherapy discussed with patient. - tear located superiorly ~1100 w/ surrounding SRF / focal RD, OD - recommend laser retinopexy OD for tear/focal RD and lattice as described below - RBA of procedure discussed, questions answered - informed consent obtained and signed - see procedure note - start PF QID x7 days OD - f/u in 1-2 wks  3,4.Lattice degeneration w/ atrophic holes, both eyes  - patches of lattice located at 0730, 0800 and 1000 OD, 0430, 0530-0600 OS  - discussed findings, prognosis, and treatment  options including observation  - recommend laser retinopexy OU, OD today, 03.04.20, follow up in 1-2 weeks for PRP OS  - pt wishes to proceed with laser  - RBA of procedure discussed, questions answered  - informed consent obtained and signed  - see procedure note  - start PF QID OD x7 days  - f/u in 1-2 wks, laser retinopexy OS  5. No retinal edema on exam or OCT  6. Diabetes mellitus, type 2 without retinopathy  - The incidence, risk factors for progression, natural history and treatment options for diabetic retinopathy  were discussed with patient.    - The need for close monitoring of blood glucose, blood pressure, and serum lipids, avoiding cigarette or any type of tobacco, and the need for long term follow up was also discussed with patient.  - f/u in 1 year, sooner prn  7,8. Hypertensive retinopathy OU - discussed importance of tight BP control - monitor  9. 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  - discussed diagnosis and progression  - not yet visually significant  - monitor for now   Ophthalmic Meds Ordered this visit:  Meds ordered this encounter  Medications  . prednisoLONE acetate (PRED FORTE) 1 % ophthalmic suspension    Sig: Place 1 drop into the right eye 4 (four) times daily for 7 days.    Dispense:  10 mL    Refill:  0       Return in about 1 week (around 02/18/2019) for POV, Laser.  There are no Patient Instructions on file for this visit.   Explained the diagnoses, plan, and follow up with the patient and they expressed understanding.  Patient expressed understanding of the importance of proper follow up care.   This document serves as a record of services personally performed by Gardiner Sleeper, MD, PhD. It was created on their behalf by Ernest Mallick, OA, an ophthalmic assistant. The creation of this record is the provider's dictation and/or activities  during the visit.    Electronically signed by: Ernest Mallick, OA  03.02.2020 12:47 AM    Gardiner Sleeper, M.D., Ph.D. Diseases & Surgery of the Retina and Vitreous [facility]  I have reviewed the above documentation for accuracy and completeness, and I agree with the above. Gardiner Sleeper, M.D., Ph.D. 02/14/19 12:52 AM      Abbreviations: M myopia (nearsighted); A astigmatism; H hyperopia (farsighted); P presbyopia; Mrx spectacle prescription;  CTL contact lenses; OD right eye; OS left eye; OU both eyes  XT exotropia; ET esotropia; PEK punctate epithelial keratitis; PEE punctate epithelial erosions; DES dry eye syndrome; MGD meibomian gland dysfunction; ATs artificial tears; PFAT's preservative free artificial tears; [place] nuclear sclerotic cataract; PSC posterior subcapsular cataract; ERM epi-retinal membrane; PVD posterior vitreous detachment; RD  retinal detachment; DM diabetes mellitus; DR diabetic retinopathy; NPDR non-proliferative diabetic retinopathy; PDR proliferative diabetic retinopathy; CSME clinically significant macular edema; DME diabetic macular edema; dbh dot blot hemorrhages; CWS cotton wool spot; POAG primary open angle glaucoma; C/D cup-to-disc ratio; HVF humphrey visual field; GVF goldmann visual field; OCT optical coherence tomography; IOP intraocular pressure; BRVO Branch retinal vein occlusion; CRVO central retinal vein occlusion; CRAO central retinal artery occlusion; BRAO branch retinal artery occlusion; RT retinal tear; SB scleral buckle; PPV pars plana vitrectomy; VH Vitreous hemorrhage; PRP panretinal laser photocoagulation; IVK intravitreal kenalog; VMT vitreomacular traction; MH Macular hole;  NVD neovascularization of the disc; NVE neovascularization elsewhere; AREDS age related eye disease study; ARMD age related macular degeneration; POAG primary open angle glaucoma; EBMD epithelial/anterior basement membrane dystrophy; ACIOL anterior chamber  intraocular lens; IOL intraocular lens; PCIOL posterior chamber intraocular lens; Phaco/IOL phacoemulsification with intraocular lens placement; [place] photorefractive keratectomy; LASIK laser assisted in situ keratomileusis; HTN hypertension; DM diabetes mellitus; COPD chronic obstructive pulmonary disease

## 2019-02-11 ENCOUNTER — Encounter (INDEPENDENT_AMBULATORY_CARE_PROVIDER_SITE_OTHER): Payer: Self-pay | Admitting: Ophthalmology

## 2019-02-11 ENCOUNTER — Ambulatory Visit (INDEPENDENT_AMBULATORY_CARE_PROVIDER_SITE_OTHER): Payer: BC Managed Care – PPO | Admitting: Ophthalmology

## 2019-02-11 DIAGNOSIS — H25813 Combined forms of age-related cataract, bilateral: Secondary | ICD-10-CM

## 2019-02-11 DIAGNOSIS — E119 Type 2 diabetes mellitus without complications: Secondary | ICD-10-CM

## 2019-02-11 DIAGNOSIS — H3321 Serous retinal detachment, right eye: Secondary | ICD-10-CM | POA: Diagnosis not present

## 2019-02-11 DIAGNOSIS — I10 Essential (primary) hypertension: Secondary | ICD-10-CM

## 2019-02-11 DIAGNOSIS — H33311 Horseshoe tear of retina without detachment, right eye: Secondary | ICD-10-CM | POA: Diagnosis not present

## 2019-02-11 DIAGNOSIS — H35413 Lattice degeneration of retina, bilateral: Secondary | ICD-10-CM

## 2019-02-11 DIAGNOSIS — H33323 Round hole, bilateral: Secondary | ICD-10-CM

## 2019-02-11 DIAGNOSIS — H35033 Hypertensive retinopathy, bilateral: Secondary | ICD-10-CM

## 2019-02-11 DIAGNOSIS — H3581 Retinal edema: Secondary | ICD-10-CM | POA: Diagnosis not present

## 2019-02-11 MED ORDER — PREDNISOLONE ACETATE 1 % OP SUSP: 1.0000 [drp] | Freq: Four times a day (QID) | OPHTHALMIC | 0 refills | Status: AC

## 2019-02-17 NOTE — Progress Notes (Signed)
[facility] Note  02/19/2019     CHIEF COMPLAINT Patient presents for Retina Follow Up   HISTORY OF PRESENT ILLNESS: Nancy [name] is a 58 y.o. female who presents to the clinic today for:   HPI    Retina Follow Up    Patient presents with  Other.  In both eyes.  Severity is moderate.  Duration of 2 weeks.  Since onset it is stable.  I, the attending physician,  performed the HPI with the patient and updated documentation appropriately.          Comments    Pt presents for lattice with atrophic holes OU f/u, pt is s/p laser OD (03.04.20), here for laser OS today, pt states vision is the same, denies flashes, floaters, or pain, pt completed 7 days of PF QID       Last edited by Bernarda Caffey, MD on 02/19/2019  3:25 PM. (History)    pt states she saw Dr. Jorja Loa for her routine diabetic exam, she states he saw a tear in her right eye, she states she was having some sharp pains that were followed by floaters that moved when she moved her eyes, pt endorses being diabetic (takes [place]), but denies HTN  Referring physician: Redmond School, MD [place], [place] [address]  HISTORICAL INFORMATION:   Selected notes from the MEDICAL RECORD NUMBER Referred by Dr. Madelin Headings for concern of retinal holes OD LEE: 02.26.20 (M. Cotter) [BCVA: 20/20 OU] Ocular Hx- PMH-DM, HLD, sickle cell trait    CURRENT MEDICATIONS: No current outpatient medications on file. (Ophthalmic Drugs)   No current facility-administered medications for this visit.  (Ophthalmic Drugs)   Current Outpatient Medications (Other)  Medication Sig  . albuterol (PROVENTIL HFA;VENTOLIN HFA) 108 (90 BASE) MCG/ACT inhaler Inhale 2 puffs into the lungs every 6 (six) hours as needed.  . Blood Glucose Monitoring Suppl (BLOOD GLUCOSE METER) kit Use as instructed, check sugars one time a day at least every other day.  Marland Kitchen FARXIGA 10 MG TABS tablet   . fluticasone (FLONASE) 50  MCG/ACT nasal spray fluticasone propionate 50 mcg/actuation nasal spray,suspension  . glipiZIDE (GLUCOTROL) 5 MG tablet glipizide 5 mg tablet  . Lancets (ONETOUCH DELICA PLUS [id number]) MISC USE 1 TO CHECK GLUCOSE TWICE DAILY  . lisinopril (PRINIVIL,ZESTRIL) 2.5 MG tablet lisinopril 2.5 mg tablet  . lovastatin (MEVACOR) 10 MG tablet lovastatin 10 mg tablet  . methocarbamol (ROBAXIN) 500 MG tablet Take 1 or 2 po Q 6hrs for muscle pain  . naproxen (NAPROSYN) 250 MG tablet Take 1 po BID with food prn pain  . ONE TOUCH ULTRA TEST test strip USE 1 STRIP TO CHECK GLUCOSE ONCE TO TWICE DAILY   No current facility-administered medications for this visit.  (Other)      REVIEW OF SYSTEMS: ROS    Positive for: Cardiovascular, Eyes   Negative for: Constitutional, Gastrointestinal, Neurological, Skin, Genitourinary, Musculoskeletal, HENT, Endocrine, Respiratory, Psychiatric, Allergic/Imm, Heme/Lymph   Last edited by Debbrah Alar, COT on 02/19/2019  1:59 PM. (History)       ALLERGIES Allergies  Allergen Reactions  . Cantaloupe (Diagnostic) Anaphylaxis  . Strawberry Extract Anaphylaxis  . Watermelon Flavor Anaphylaxis  . Metformin And Related Other (See Comments)    Lethargic, swelling of the eyes, drowsiness  . Penicillins Hives    PAST MEDICAL HISTORY Past Medical History:  Diagnosis Date  . Asthma   . Diabetes mellitus without complication ([place])   .  Hypertension    Past Surgical History:  Procedure Laterality Date  . BREAST SURGERY    . REPLACEMENT TOTAL KNEE    . right foot      FAMILY HISTORY Family History  Problem Relation Age of Onset  . Anesthesia problems Other        family history   . Diabetes Other        family history   . Glaucoma Sister   . Glaucoma Brother     SOCIAL HISTORY Social History   Tobacco Use  . Smoking status: Never Smoker  . Smokeless tobacco: Never Used  Substance Use Topics  . Alcohol use: No  . Drug use: No          OPHTHALMIC EXAM:  Base Eye Exam    Visual Acuity (Snellen - Linear)      Right Left   Dist cc 20/25 20/20 -2   Dist ph cc 20/20        Tonometry (Tonopen, 9:22 AM)      Right Left   Pressure 13 12       Pupils      APD   Right None   Left None       Visual Fields (Counting fingers)      Left Right    Full Full       Extraocular Movement      Right Left    Full, Ortho Full, Ortho       Neuro/Psych    Oriented x3:  Yes   Mood/Affect:  Normal       Dilation    Both eyes:  1.0% Mydriacyl, 2.5% Phenylephrine @ 9:22 AM        Slit Lamp and Fundus Exam    Slit Lamp Exam      Right Left   Lids/Lashes Dermatochalasis - upper lid, Meibomian gland dysfunction Dermatochalasis - upper lid, Meibomian gland dysfunction   Conjunctiva/Sclera mild Melanosis mild Melanosis   Cornea mild Arcus, 1+ Punctate epithelial erosions mild Arcus, 1+ Punctate epithelial erosions   Anterior Chamber Deep and quiet Deep and quiet   Iris Round and dilated, No NVI Round and dilated, No NVI   Lens 1+ Nuclear sclerosis, 1+ Cortical cataract 1+ Nuclear sclerosis, 1+ Cortical cataract   Vitreous Vitreous syneresis, Posterior vitreous detachment, mild vitreous condensations inferiorly Vitreous syneresis, Posterior vitreous detachment       Fundus Exam      Right Left   Disc +cupping, Sharp rim, inferior narrowing of rim Pink and Sharp   C/D Ratio 0.5 0.5   Macula Flat, Good foveal reflex, No heme or edema Flat, Good foveal reflex   Vessels mild Vascular attenuation, Copper wiring mild Copper wiring, Vascular attenuation   Periphery retinal tear with surrounding SRF superiorly; pigmented lattice at 0730, 0800 and 1000 -- good early laser changes Attached, pigmented lattice at 4193-7902            IMAGING AND PROCEDURES  Imaging and Procedures for _0 @  OCT, Retina - OU - Both Eyes       Right Eye Quality was good. Central Foveal Thickness: 284. Progression has been stable.  Findings include normal foveal contour, no SRF, no IRF.   Left Eye Quality was good. Central Foveal Thickness: 272. Progression has been stable. Findings include normal foveal contour, no SRF, no IRF.   Notes *Images captured and stored on drive  Diagnosis / Impression:  NFP, no IRF/SRF OU  Clinical management:  See  below  Abbreviations: NFP - Normal foveal profile. CME - cystoid macular edema. PED - pigment epithelial detachment. IRF - intraretinal fluid. SRF - subretinal fluid. EZ - ellipsoid zone. ERM - epiretinal membrane. ORA - outer retinal atrophy. ORT - outer retinal tubulation. SRHM - subretinal hyper-reflective material        Repair Retinal Breaks, Laser - OS - Left Eye       LASER PROCEDURE NOTE  Procedure:  Barrier laser retinopexy using slit lamp laser, LEFT eye   Diagnosis:   Lattice degeneration w/ atrophic holes, LEFT eye                     Patches of lattice: 430-630  Surgeon: Bernarda Caffey, MD, PhD  Anesthesia: Topical  Informed consent obtained, operative eye marked, and time out performed prior to initiation of laser.   Laser settings:  Lumenis Smart532 laser, slit lamp Lens: [place] PRP 165 Power: 290 mW Spot size: 200 microns Duration: 30 msec  # spots: 452  Placement of laser: Using a [place] PRP 165 contact lens at the slit lamp, laser was placed in three confluent rows around patches of lattice w/ atrophic holes inferiorly from 430-630 oclock anterior to equator.  Complications: None.  Patient tolerated the procedure well and received written and verbal post-procedure care information/education.                 ASSESSMENT/PLAN:    ICD-10-CM   1. Retinal tear of right eye H33.311   2. Right retinal detachment H33.21   3. Lattice degeneration of both retinas H35.413 Repair Retinal Breaks, Laser - OS - Left Eye  4. Retinal holes, bilateral H33.323 Repair Retinal Breaks, Laser - OS - Left Eye  5. Retinal edema H35.81 OCT,  Retina - OU - Both Eyes  6. Diabetes mellitus type 2 without retinopathy ([place]) E11.9   7. Essential hypertension I10   8. Hypertensive retinopathy of both eyes H35.033   9. Combined forms of age-related cataract of both eyes H25.813     1,2. Retinal tear w/ surrounding SRF / focal RD, OD - tear located superiorly ~1100 w/ surrounding SRF / focal RD, OD - s/p laser retinopexy OD for tear/focal RD and lattice, 3.4.2020 - good early laser changes in place - completed PF QID x7 days OD - f/u in 1-2 wks  3,4.Lattice degeneration w/ atrophic holes, both eyes  - patches of lattice located at 0730, 0800 and 1000 OD, 0430, 0530-0600 OS  - discussed findings, prognosis, and treatment options including observation  - s/p laser retinopexy OD 03.04.20  - recommend laser retinopexy OS today, 03.12.20  - pt wishes to proceed with laser  - RBA of procedure discussed, questions answered  - informed consent obtained and signed  - see procedure note  - start PF QID OS x7 days  - f/u in 1-2 wks  5. No retinal edema on exam or OCT  6. Diabetes mellitus, type 2 without retinopathy  - The incidence, risk factors for progression, natural history and treatment options for diabetic retinopathy  were discussed with patient.    - The need for close monitoring of blood glucose, blood pressure, and serum lipids, avoiding cigarette or any type of tobacco, and the need for long term follow up was also discussed with patient.  - f/u in 1 year, sooner prn  7,8. Hypertensive retinopathy OU - discussed importance of tight BP control - monitor  9. Mild Mixed for age related cataracts OU  -  The symptoms of cataract, surgical options, and treatments and risks were discussed with patient.  - discussed diagnosis and progression  - not yet visually significant  - monitor for now   Ophthalmic Meds Ordered this visit:  No orders of the defined types were placed in this encounter.      Return for 1-2 wks,  POV.  There are no Patient Instructions on file for this visit.   Explained the diagnoses, plan, and follow up with the patient and they expressed understanding.  Patient expressed understanding of the importance of proper follow up care.   This document serves as a record of services personally performed by Gardiner Sleeper, MD, PhD. It was created on their behalf by Ernest Mallick, OA, an ophthalmic assistant. The creation of this record is the provider's dictation and/or activities during the visit.    Electronically signed by: Ernest Mallick, OA  03.10.2020 11:28 AM    Gardiner Sleeper, M.D., Ph.D. Diseases & Surgery of the Retina and Vitreous [facility]  I have reviewed the above documentation for accuracy and completeness, and I agree with the above. Gardiner Sleeper, M.D., Ph.D. 02/22/19 11:30 AM   Abbreviations: M myopia (nearsighted); A astigmatism; H hyperopia (farsighted); P presbyopia; Mrx spectacle prescription;  CTL contact lenses; OD right eye; OS left eye; OU both eyes  XT exotropia; ET esotropia; PEK punctate epithelial keratitis; PEE punctate epithelial erosions; DES dry eye syndrome; MGD meibomian gland dysfunction; ATs artificial tears; PFAT's preservative free artificial tears; [place] nuclear sclerotic cataract; PSC posterior subcapsular cataract; ERM epi-retinal membrane; PVD posterior vitreous detachment; RD retinal detachment; DM diabetes mellitus; DR diabetic retinopathy; NPDR non-proliferative diabetic retinopathy; PDR proliferative diabetic retinopathy; CSME clinically significant macular edema; DME diabetic macular edema; dbh dot blot hemorrhages; CWS cotton wool spot; POAG primary open angle glaucoma; C/D cup-to-disc ratio; HVF humphrey visual field; GVF goldmann visual field; OCT optical coherence tomography; IOP intraocular pressure; BRVO Branch retinal vein occlusion; CRVO central retinal vein occlusion; CRAO central retinal artery occlusion; BRAO  branch retinal artery occlusion; RT retinal tear; SB scleral buckle; PPV pars plana vitrectomy; VH Vitreous hemorrhage; PRP panretinal laser photocoagulation; IVK intravitreal kenalog; VMT vitreomacular traction; MH Macular hole;  NVD neovascularization of the disc; NVE neovascularization elsewhere; AREDS age related eye disease study; ARMD age related macular degeneration; POAG primary open angle glaucoma; EBMD epithelial/anterior basement membrane dystrophy; ACIOL anterior chamber intraocular lens; IOL intraocular lens; PCIOL posterior chamber intraocular lens; Phaco/IOL phacoemulsification with intraocular lens placement; [place] photorefractive keratectomy; LASIK laser assisted in situ keratomileusis; HTN hypertension; DM diabetes mellitus; COPD chronic obstructive pulmonary disease

## 2019-02-19 ENCOUNTER — Ambulatory Visit (INDEPENDENT_AMBULATORY_CARE_PROVIDER_SITE_OTHER): Payer: BC Managed Care – PPO | Admitting: Ophthalmology

## 2019-02-19 ENCOUNTER — Encounter (INDEPENDENT_AMBULATORY_CARE_PROVIDER_SITE_OTHER): Payer: Self-pay | Admitting: Ophthalmology

## 2019-02-19 ENCOUNTER — Other Ambulatory Visit: Payer: Self-pay

## 2019-02-19 DIAGNOSIS — H35413 Lattice degeneration of retina, bilateral: Secondary | ICD-10-CM

## 2019-02-19 DIAGNOSIS — E119 Type 2 diabetes mellitus without complications: Secondary | ICD-10-CM

## 2019-02-19 DIAGNOSIS — H3321 Serous retinal detachment, right eye: Secondary | ICD-10-CM | POA: Diagnosis not present

## 2019-02-19 DIAGNOSIS — H3581 Retinal edema: Secondary | ICD-10-CM | POA: Diagnosis not present

## 2019-02-19 DIAGNOSIS — H25813 Combined forms of age-related cataract, bilateral: Secondary | ICD-10-CM

## 2019-02-19 DIAGNOSIS — H35033 Hypertensive retinopathy, bilateral: Secondary | ICD-10-CM

## 2019-02-19 DIAGNOSIS — H33311 Horseshoe tear of retina without detachment, right eye: Secondary | ICD-10-CM

## 2019-02-19 DIAGNOSIS — I10 Essential (primary) hypertension: Secondary | ICD-10-CM

## 2019-02-19 DIAGNOSIS — H33323 Round hole, bilateral: Secondary | ICD-10-CM | POA: Diagnosis not present

## 2019-03-02 ENCOUNTER — Encounter (INDEPENDENT_AMBULATORY_CARE_PROVIDER_SITE_OTHER): Payer: BC Managed Care – PPO | Admitting: Ophthalmology

## 2019-03-18 ENCOUNTER — Encounter (INDEPENDENT_AMBULATORY_CARE_PROVIDER_SITE_OTHER): Payer: BC Managed Care – PPO | Admitting: Ophthalmology

## 2019-04-06 ENCOUNTER — Ambulatory Visit (INDEPENDENT_AMBULATORY_CARE_PROVIDER_SITE_OTHER): Payer: BC Managed Care – PPO | Admitting: Ophthalmology

## 2019-04-06 ENCOUNTER — Encounter (INDEPENDENT_AMBULATORY_CARE_PROVIDER_SITE_OTHER): Payer: Self-pay | Admitting: Ophthalmology

## 2019-04-06 ENCOUNTER — Other Ambulatory Visit: Payer: Self-pay

## 2019-04-06 DIAGNOSIS — H35033 Hypertensive retinopathy, bilateral: Secondary | ICD-10-CM

## 2019-04-06 DIAGNOSIS — E119 Type 2 diabetes mellitus without complications: Secondary | ICD-10-CM

## 2019-04-06 DIAGNOSIS — H33311 Horseshoe tear of retina without detachment, right eye: Secondary | ICD-10-CM

## 2019-04-06 DIAGNOSIS — H3581 Retinal edema: Secondary | ICD-10-CM

## 2019-04-06 DIAGNOSIS — H35413 Lattice degeneration of retina, bilateral: Secondary | ICD-10-CM

## 2019-04-06 DIAGNOSIS — H33323 Round hole, bilateral: Secondary | ICD-10-CM

## 2019-04-06 DIAGNOSIS — I10 Essential (primary) hypertension: Secondary | ICD-10-CM

## 2019-04-06 DIAGNOSIS — H25813 Combined forms of age-related cataract, bilateral: Secondary | ICD-10-CM

## 2019-04-06 DIAGNOSIS — H3321 Serous retinal detachment, right eye: Secondary | ICD-10-CM

## 2019-04-06 NOTE — Progress Notes (Signed)
[facility] Note  04/06/2019     CHIEF COMPLAINT Patient presents for Retina Follow Up   HISTORY OF PRESENT ILLNESS: Nancy [name] is a 58 y.o. female who presents to the clinic today for:   HPI    Retina Follow Up    Patient presents with  Other.  In both eyes.  This started 6.5 weeks ago.  Severity is moderate.  Duration of 6.5 weeks.  Since onset it is stable.  I, the attending physician,  performed the HPI with the patient and updated documentation appropriately.          Comments    Patient here for 6 1/2 weeks retina follow up for lattice with atrophic holes OU (S/P laser OD 03.04 and OS 03.10) Patient states vision doing good. Did have eye pain after last visit for couple of days. No eye pain now. Still has blurry place OD.        Last edited by Bernarda Caffey, MD on 04/06/2019 10:15 AM. (History)    pt states she still has a blurry spot in her right eye, she states it's more in her peripheral vision than in her central vision  Referring physician: Redmond School, MD [place], [place] [address]  HISTORICAL INFORMATION:   Selected notes from the MEDICAL RECORD NUMBER Referred by Dr. Madelin Headings for concern of retinal holes OD LEE: 02.26.20 (M. Cotter) [BCVA: 20/20 OU] Ocular Hx- PMH-DM, HLD, sickle cell trait    CURRENT MEDICATIONS: No current outpatient medications on file. (Ophthalmic Drugs)   No current facility-administered medications for this visit.  (Ophthalmic Drugs)   Current Outpatient Medications (Other)  Medication Sig  . albuterol (PROVENTIL HFA;VENTOLIN HFA) 108 (90 BASE) MCG/ACT inhaler Inhale 2 puffs into the lungs every 6 (six) hours as needed.  . Blood Glucose Monitoring Suppl (BLOOD GLUCOSE METER) kit Use as instructed, check sugars one time a day at least every other day.  Marland Kitchen FARXIGA 10 MG TABS tablet   . fluticasone (FLONASE) 50 MCG/ACT nasal spray fluticasone propionate 50 mcg/actuation nasal  spray,suspension  . glipiZIDE (GLUCOTROL) 5 MG tablet glipizide 5 mg tablet  . Lancets (ONETOUCH DELICA PLUS [id number]) MISC USE 1 TO CHECK GLUCOSE TWICE DAILY  . lisinopril (PRINIVIL,ZESTRIL) 2.5 MG tablet lisinopril 2.5 mg tablet  . lovastatin (MEVACOR) 10 MG tablet lovastatin 10 mg tablet  . methocarbamol (ROBAXIN) 500 MG tablet Take 1 or 2 po Q 6hrs for muscle pain  . naproxen (NAPROSYN) 250 MG tablet Take 1 po BID with food prn pain  . ONE TOUCH ULTRA TEST test strip USE 1 STRIP TO CHECK GLUCOSE ONCE TO TWICE DAILY   No current facility-administered medications for this visit.  (Other)      REVIEW OF SYSTEMS: ROS    Positive for: Endocrine, Cardiovascular, Eyes   Negative for: Constitutional, Gastrointestinal, Neurological, Skin, Genitourinary, Musculoskeletal, HENT, Respiratory, Psychiatric, Allergic/Imm, Heme/Lymph   Last edited by Theodore Demark on 04/06/2019 10:10 AM. (History)       ALLERGIES Allergies  Allergen Reactions  . Cantaloupe (Diagnostic) Anaphylaxis  . Strawberry Extract Anaphylaxis  . Watermelon Flavor Anaphylaxis  . Metformin And Related Other (See Comments)    Lethargic, swelling of the eyes, drowsiness  . Penicillins Hives    PAST MEDICAL HISTORY Past Medical History:  Diagnosis Date  . Asthma   . Diabetes mellitus without complication ([place])   . Hypertension    Past Surgical History:  Procedure Laterality Date  .  BREAST SURGERY    . REPLACEMENT TOTAL KNEE    . right foot      FAMILY HISTORY Family History  Problem Relation Age of Onset  . Anesthesia problems Other        family history   . Diabetes Other        family history   . Glaucoma Sister   . Glaucoma Brother     SOCIAL HISTORY Social History   Tobacco Use  . Smoking status: Never Smoker  . Smokeless tobacco: Never Used  Substance Use Topics  . Alcohol use: No  . Drug use: No         OPHTHALMIC EXAM:  Base Eye Exam    Visual Acuity (Snellen - Linear)       Right Left   Dist cc 20/25 +1 20/20   Dist ph cc NI        Tonometry (Tonopen, 10:05 AM)      Right Left   Pressure 22 16       Pupils      Dark Light Shape React APD   Right 3 2 Round Brisk None   Left 3 2 Round Brisk None       Visual Fields (Counting fingers)      Left Right    Full Full       Extraocular Movement      Right Left    Full, Ortho Full, Ortho       Neuro/Psych    Oriented x3:  Yes   Mood/Affect:  Normal       Dilation    Both eyes:  1.0% Mydriacyl, 2.5% Phenylephrine @ 10:05 AM        Slit Lamp and Fundus Exam    Slit Lamp Exam      Right Left   Lids/Lashes Dermatochalasis - upper lid, Meibomian gland dysfunction Dermatochalasis - upper lid, Meibomian gland dysfunction   Conjunctiva/Sclera mild Melanosis mild Melanosis   Cornea mild Arcus, 1+ inferiorly Punctate epithelial erosions mild Arcus, 1+ Punctate epithelial erosions   Anterior Chamber Deep and quiet Deep and quiet   Iris Round and dilated, No NVI Round and dilated, No NVI   Lens 1+ Nuclear sclerosis, 2+ Cortical cataract 1+ Nuclear sclerosis, 2+ Cortical cataract   Vitreous Vitreous syneresis, Posterior vitreous detachment, mild vitreous condensations inferiorly Vitreous syneresis, Posterior vitreous detachment       Fundus Exam      Right Left   Disc +cupping, Sharp rim, inferior narrowing of rim Pink and Sharp   C/D Ratio 0.7 0.5   Macula Flat, Good foveal reflex, RPE mottling, mild ERM, No heme or edema Flat, Good foveal reflex, mild Retinal pigment epithelial mottling, No heme or edema   Vessels mild Vascular attenuation, mild Copper wiring, AV crossing changes Vascular attenuation, midl Copper wiring, AV crossing changes   Periphery retinal tear with surrounding SRF superiorly (1100); pigmented lattice at 0730, 0800 and 1000 -- good laser changes around all lesions Attached, pigmented lattice at 7408-1448 - good laser changes surrounding patches of lattice          Refraction     Wearing Rx      Sphere Cylinder Axis   Right -6.25 +1.50 093   Left -5.25 +1.25 090   Type:  SVL          IMAGING AND PROCEDURES  Imaging and Procedures for @TODAY @  OCT, Retina - OU - Both Eyes  Right Eye Quality was good. Central Foveal Thickness: 284. Progression has been stable. Findings include normal foveal contour, no SRF, no IRF.   Left Eye Quality was good. Central Foveal Thickness: 279. Progression has been stable. Findings include normal foveal contour, no SRF, no IRF.   Notes *Images captured and stored on drive  Diagnosis / Impression:  NFP, no IRF/SRF OU  Clinical management:  See below  Abbreviations: NFP - Normal foveal profile. CME - cystoid macular edema. PED - pigment epithelial detachment. IRF - intraretinal fluid. SRF - subretinal fluid. EZ - ellipsoid zone. ERM - epiretinal membrane. ORA - outer retinal atrophy. ORT - outer retinal tubulation. SRHM - subretinal hyper-reflective material                 ASSESSMENT/PLAN:    ICD-10-CM   1. Retinal tear of right eye H33.311   2. Right retinal detachment H33.21   3. Lattice degeneration of both retinas H35.413   4. 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 Retinal edema H35.81 OCT, Retina - OU - Both Eyes  6. Diabetes mellitus type 2 without retinopathy ([place]) E11.9   7. Essential hypertension I10   8. Hypertensive retinopathy of both eyes H35.033   9. Combined forms of age-related cataract of both eyes H25.813     1,2. Retinal tear w/ surrounding SRF / focal RD, OD  - tear located superiorly ~1100 w/ surrounding SRF / focal RD, OD  - s/p laser retinopexy OD for tear/focal RD and lattice, 3.4.2020 -- good laser changes in place  - f/u in 3 mos  3,4.Lattice degeneration w/ atrophic holes, both eyes  - patches of lattice located at 0730, 0800 and 1000 OD, 0430, 0530-0600 OS  - s/p laser retinopexy OD (03.04.20) -- good laser changes in place  - s/p laser retinopexy OS (03.12.20) --  good laser changes in place  - f/u in 3 mos  5. No retinal edema on exam or OCT  6. Diabetes mellitus, type 2 without retinopathy  - The incidence, risk factors for progression, natural history and treatment options for diabetic retinopathy  were discussed with patient.    - The need for close monitoring of blood glucose, blood pressure, and serum lipids, avoiding cigarette or any type of tobacco, and the need for long term follow up was also discussed with patient.  - f/u in 1 year, sooner prn  7,8. Hypertensive retinopathy OU  - discussed importance of tight BP control  - monitor  9. 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  - discussed diagnosis and progression  - not yet visually significant  - monitor for now   Ophthalmic Meds Ordered this visit:  No orders of the defined types were placed in this encounter.      Return in about 3 months (around 07/06/2019) for f/u lattice degeneration OU, DFE, OCT.  There are no Patient Instructions on file for this visit.   Explained the diagnoses, plan, and follow up with the patient and they expressed understanding.  Patient expressed understanding of the importance of proper follow up care.   This document serves as a record of services personally performed by Gardiner Sleeper, MD, PhD. It was created on their behalf by Ernest Mallick, OA, an ophthalmic assistant. The creation of this record is the provider's dictation and/or activities during the visit.    Electronically signed by: Ernest Mallick, OA  04.27.2020 1:00 PM    Gardiner Sleeper, M.D., Ph.D. Diseases & Surgery of the Retina and [place]  I have reviewed the above documentation for accuracy and completeness, and I agree with the above. Gardiner Sleeper, M.D., Ph.D. 04/06/19 1:00 PM     Abbreviations: M myopia (nearsighted); A astigmatism; H  hyperopia (farsighted); P presbyopia; Mrx spectacle prescription;  CTL contact lenses; OD right eye; OS left eye; OU both eyes  XT exotropia; ET esotropia; PEK punctate epithelial keratitis; PEE punctate epithelial erosions; DES dry eye syndrome; MGD meibomian gland dysfunction; ATs artificial tears; PFAT's preservative free artificial tears; [place] nuclear sclerotic cataract; PSC posterior subcapsular cataract; ERM epi-retinal membrane; PVD posterior vitreous detachment; RD retinal detachment; DM diabetes mellitus; DR diabetic retinopathy; NPDR non-proliferative diabetic retinopathy; PDR proliferative diabetic retinopathy; CSME clinically significant macular edema; DME diabetic macular edema; dbh dot blot hemorrhages; CWS cotton wool spot; POAG primary open angle glaucoma; C/D cup-to-disc ratio; HVF humphrey visual field; GVF goldmann visual field; OCT optical coherence tomography; IOP intraocular pressure; BRVO Branch retinal vein occlusion; CRVO central retinal vein occlusion; CRAO central retinal artery occlusion; BRAO branch retinal artery occlusion; RT retinal tear; SB scleral buckle; PPV pars plana vitrectomy; VH Vitreous hemorrhage; PRP panretinal laser photocoagulation; IVK intravitreal kenalog; VMT vitreomacular traction; MH Macular hole;  NVD neovascularization of the disc; NVE neovascularization elsewhere; AREDS age related eye disease study; ARMD age related macular degeneration; POAG primary open angle glaucoma; EBMD epithelial/anterior basement membrane dystrophy; ACIOL anterior chamber intraocular lens; IOL intraocular lens; PCIOL posterior chamber intraocular lens; Phaco/IOL phacoemulsification with intraocular lens placement; [place] photorefractive keratectomy; LASIK laser assisted in situ keratomileusis; HTN hypertension; DM diabetes mellitus; COPD chronic obstructive pulmonary disease

## 2019-07-02 ENCOUNTER — Telehealth (INDEPENDENT_AMBULATORY_CARE_PROVIDER_SITE_OTHER): Payer: Self-pay | Admitting: Ophthalmology

## 2019-07-02 ENCOUNTER — Ambulatory Visit (INDEPENDENT_AMBULATORY_CARE_PROVIDER_SITE_OTHER): Payer: BC Managed Care – PPO | Admitting: Ophthalmology

## 2019-07-02 ENCOUNTER — Encounter (INDEPENDENT_AMBULATORY_CARE_PROVIDER_SITE_OTHER): Payer: Self-pay | Admitting: Ophthalmology

## 2019-07-02 ENCOUNTER — Other Ambulatory Visit: Payer: Self-pay

## 2019-07-02 DIAGNOSIS — I10 Essential (primary) hypertension: Secondary | ICD-10-CM

## 2019-07-02 DIAGNOSIS — H33311 Horseshoe tear of retina without detachment, right eye: Secondary | ICD-10-CM

## 2019-07-02 DIAGNOSIS — H3581 Retinal edema: Secondary | ICD-10-CM | POA: Diagnosis not present

## 2019-07-02 DIAGNOSIS — E119 Type 2 diabetes mellitus without complications: Secondary | ICD-10-CM

## 2019-07-02 DIAGNOSIS — H25813 Combined forms of age-related cataract, bilateral: Secondary | ICD-10-CM

## 2019-07-02 DIAGNOSIS — H35413 Lattice degeneration of retina, bilateral: Secondary | ICD-10-CM

## 2019-07-02 DIAGNOSIS — H3321 Serous retinal detachment, right eye: Secondary | ICD-10-CM

## 2019-07-02 DIAGNOSIS — H33323 Round hole, bilateral: Secondary | ICD-10-CM | POA: Diagnosis not present

## 2019-07-02 DIAGNOSIS — H3320 Serous retinal detachment, unspecified eye: Secondary | ICD-10-CM

## 2019-07-02 DIAGNOSIS — H35033 Hypertensive retinopathy, bilateral: Secondary | ICD-10-CM

## 2019-07-02 HISTORY — DX: Serous retinal detachment, unspecified eye: H33.20

## 2019-07-02 HISTORY — PX: RETINAL DETACHMENT SURGERY: SHX105

## 2019-07-02 MED ORDER — MAXITROL 3.5-10000-0.1 OP OINT: 1.0000 "application " | TOPICAL_OINTMENT | Freq: Four times a day (QID) | OPHTHALMIC | 2 refills | Status: DC

## 2019-07-02 NOTE — Progress Notes (Signed)
[facility] Note  07/02/2019     CHIEF COMPLAINT Patient presents for Retina Evaluation   HISTORY OF PRESENT ILLNESS: Nancy [name] is a 58 y.o. female who presents to the clinic today for:   HPI    Retina Evaluation    In right eye.  This started 1 day ago.  Duration of 1 day.  Associated Symptoms Floaters.  Negative for Flashes, Blind Spot, Photophobia, Scalp Tenderness, Fever, Pain, Glare, Jaw Claudication, Weight Loss, Distortion, Redness, Trauma, Shoulder/Hip pain and Fatigue.  Context:  distance vision, mid-range vision and near vision.  Treatments tried include no treatments.  I, the attending physician,  performed the HPI with the patient and updated documentation appropriately.          Comments    Patient states sudden onset of blurry vision OD today. Spots in vision OD, spots have been present for the past 1-2 weeks. Patient sees brown in vision OD. Patient states vision has gotten worse in the past hour since speaking to triage tech on the phone. Denies flashes. BS was 127 about half an hour ago. A1c was less than 7, checked about 6 months ago. History of laser retinopexy OU.       Last edited by Bernarda Caffey, MD on 07/02/2019  5:42 PM. (History)     Spots in vision OD for the past 1-2 weeks. Sudden onset of blurry vision OD. Vision continued to get worse on trip over to office. Patient denies head/eye trauma.  Referring physician: Redmond School, MD [address] [place],  [place] [address]  HISTORICAL INFORMATION:   Selected notes from the MEDICAL RECORD NUMBER Referred by Dr. Madelin Headings for concern of retinal holes OD LEE: 02.26.20 (M. Cotter) [BCVA: 20/20 OU] Ocular Hx- PMH-DM, HLD, sickle cell trait    CURRENT MEDICATIONS: Current Outpatient Medications (Ophthalmic Drugs)  Medication Sig  . neomycin-polymyxin-dexameth (MAXITROL) 0.1 % OINT Place 1 application into the right eye 4 (four) times daily.   No current  facility-administered medications for this visit.  (Ophthalmic Drugs)   Current Outpatient Medications (Other)  Medication Sig  . albuterol (PROVENTIL HFA;VENTOLIN HFA) 108 (90 BASE) MCG/ACT inhaler Inhale 2 puffs into the lungs every 6 (six) hours as needed.  . Blood Glucose Monitoring Suppl (BLOOD GLUCOSE METER) kit Use as instructed, check sugars one time a day at least every other day.  Marland Kitchen FARXIGA 10 MG TABS tablet   . Lancets (ONETOUCH DELICA PLUS [id number]) MISC USE 1 TO CHECK GLUCOSE TWICE DAILY  . naproxen (NAPROSYN) 250 MG tablet Take 1 po BID with food prn pain  . ONE TOUCH ULTRA TEST test strip USE 1 STRIP TO CHECK GLUCOSE ONCE TO TWICE DAILY  . fluticasone (FLONASE) 50 MCG/ACT nasal spray fluticasone propionate 50 mcg/actuation nasal spray,suspension  . glipiZIDE (GLUCOTROL) 5 MG tablet glipizide 5 mg tablet  . lisinopril (PRINIVIL,ZESTRIL) 2.5 MG tablet lisinopril 2.5 mg tablet  . lovastatin (MEVACOR) 10 MG tablet lovastatin 10 mg tablet  . methocarbamol (ROBAXIN) 500 MG tablet Take 1 or 2 po Q 6hrs for muscle pain (Patient not taking: Reported on 07/02/2019)   No current facility-administered medications for this visit.  (Other)      REVIEW OF SYSTEMS: ROS    Positive for: Endocrine, Cardiovascular, Eyes   Negative for: Constitutional, Gastrointestinal, Neurological, Skin, Genitourinary, Musculoskeletal, HENT, Respiratory, Psychiatric, Allergic/Imm, Heme/Lymph   Last edited by Roselee Nova D on 07/02/2019  3:18 PM. (History)  ALLERGIES Allergies  Allergen Reactions  . Cantaloupe (Diagnostic) Anaphylaxis  . Strawberry Extract Anaphylaxis  . Watermelon Flavor Anaphylaxis  . Metformin And Related Other (See Comments)    Lethargic, swelling of the eyes, drowsiness  . Penicillins Hives    PAST MEDICAL HISTORY Past Medical History:  Diagnosis Date  . Asthma   . Diabetes mellitus without complication ([place])   . Hypertension    Past Surgical History:   Procedure Laterality Date  . BREAST SURGERY    . REPLACEMENT TOTAL KNEE    . right foot      FAMILY HISTORY Family History  Problem Relation Age of Onset  . Anesthesia problems Other        family history   . Diabetes Other        family history   . Glaucoma Sister   . Glaucoma Brother     SOCIAL HISTORY Social History   Tobacco Use  . Smoking status: Never Smoker  . Smokeless tobacco: Never Used  Substance Use Topics  . Alcohol use: No  . Drug use: No         OPHTHALMIC EXAM:  Base Eye Exam    Visual Acuity (Snellen - Linear)      Right Left   Dist cc CF at face 20/20   Dist ph cc NI    Correction: Glasses       Tonometry (Tonopen, 3:25 PM)      Right Left   Pressure 14 17       Pupils      Dark Light Shape React APD   Right 3 2.5 Round Minimal +1   Left 3 2 Round Brisk None       Visual Fields (Counting fingers)      Left Right    Full    Restrictions  Total superior temporal, inferior temporal, inferior nasal deficiencies       Extraocular Movement      Right Left    Full, Ortho Full, Ortho       Neuro/Psych    Oriented x3: Yes   Mood/Affect: Normal       Dilation    Both eyes: 1.0% Mydriacyl, 2.5% Phenylephrine @ 3:25 PM        Slit Lamp and Fundus Exam    Slit Lamp Exam      Right Left   Lids/Lashes Dermatochalasis - upper lid, Meibomian gland dysfunction Dermatochalasis - upper lid, mild Meibomian gland dysfunction   Conjunctiva/Sclera mild Melanosis mild Melanosis   Cornea mild Arcus, 1-2+ inferiorly Punctate epithelial erosions mild Arcus, 1+ Punctate epithelial erosions   Anterior Chamber Deep and quiet Deep and quiet   Iris Round and dilated, No NVI Round and dilated, No NVI   Lens 1+ Nuclear sclerosis,1- 2+ Cortical cataract 1+ Nuclear sclerosis, 1+ Cortical cataract   Vitreous Vitreous syneresis, + pigment, PVD, vitreous condensations inferiorly Vitreous syneresis, Posterior vitreous detachment       Fundus Exam       Right Left   Disc pink and sharp, partially obscured by  Bullous superior temporal RD Pink and Sharp   C/D Ratio  0.5   Macula Flat, Good foveal reflex, RPE mottling, mild ERM, No heme or edema Flat, Good foveal reflex, mild Retinal pigment epithelial mottling, No heme or edema   Vessels mild Vascular attenuation, mild Copper wiring, AV crossing changes Vascular attenuation, mild Copper wiring, AV crossing changes   Periphery bullous superior temporal RD starting at 0900 to  0100.  Horse shoe retinal tear with bridging vessel superiorly (1100); lattice with retinal holes at 1030,  Attached, pigmented lattice at 1610-9604 - good laser changes surrounding patches of lattice          Refraction    Wearing Rx      Sphere Cylinder Axis   Right -6.25 +1.50 093   Left -5.25 +1.25 090   Type: SVL          IMAGING AND PROCEDURES  Imaging and Procedures for _0 @  OCT, Retina - OU - Both Eyes       Right Eye Quality was good. Progression has worsened. Findings include subretinal fluid.   Left Eye Quality was good. Central Foveal Thickness: 276. Progression has been stable. Findings include normal foveal contour, no SRF, no IRF.   Notes *Images captured and stored on drive  Diagnosis / Impression:  OD: Bullous superior temporal detachment, involving macula and fovea OS: NFP, no IRF/SRF  Clinical management:  See below  Abbreviations: NFP - Normal foveal profile. CME - cystoid macular edema. PED - pigment epithelial detachment. IRF - intraretinal fluid. SRF - subretinal fluid. EZ - ellipsoid zone. ERM - epiretinal membrane. ORA - outer retinal atrophy. ORT - outer retinal tubulation. SRHM - subretinal hyper-reflective material        Pneumatic Retinopexy - OD - Right Eye       PROCEDURE NOTE  Diagnosis:  Retinal detachment with retinal tear, RIGHT EYE  Procedure:  Pneumatic cryopexy with C3F8 gas injection, RIGHT EYE  CPT:  54098  Surgeon: Bernarda Caffey,  M.D.,Ph.D.  Anesthesia:  Subconjunctival Lidocaine   The patient was brought to the procedure room. Informed consent had already been obtained for cryopexy of tear and intravitreal gas injection. The diagnosis was reviewed with the patient and all questions were answered. The risks of the procedure including potential systemic risks like stroke and heart attack and other thrombotic events as well as the local risks like infection, endophthalmitis, retinal detachment were discussed. The patient consented to the procedure.   The RIGHT eye was marked and a time out was performed identifying the correct eye. Subsequently, the patient was placed in the supine position. Topical anesthetic drops were given and a lid speculum was placed to expose the eye. Subsequently, 2% Lidocaine was injected subconjunctivally in the quadrants of the tears giving adequate anesthesia. Using indirect ophthalmoscopy the cryo probe was positioned beneath the tear at 1100 and choroidal and retinal whitening was achieved 360 degrees around the tear margins. Smaller retinal breaks were identified at 1030 and cryopexy was performed on those lesions. The inferotemporal quadrant was then cleaned with Betadine swabs and allowed to dry. At this time, 4.0 mm posterior to the limbus utilizing a 30-gauge needle, 0.45 cc of pure, 100% C3F8 gas was injected into the vitreous cavity under direct visualization. The needle was then withdrawn from the eye and the retina and gas bubble were visualized. An AC tap was performed to normalize the pressure and the central retinal artery was noted to be patent. Betadine was then reapplied to the injection sites and then rinsed with sterile BSS. A drop of polymixin ophthalmic soln and Tobradex opthalmic ung were applied to the eye, then the eye was double patched with eye pads. An arrow was drawn on the dressing to assist with post-operative positioning. There were no complications. Discharge and post-operative  instructions were reviewed.  ASSESSMENT/PLAN:    ICD-10-CM   1. Right retinal detachment  H33.21 Pneumatic Retinopexy - OD - Right Eye  2. 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 Retinal holes, bilateral  H33.323   5. Retinal edema  H35.81 OCT, Retina - OU - Both Eyes  6. Diabetes mellitus type 2 without retinopathy ([place])  E11.9   7. Essential hypertension  I10   8. Hypertensive retinopathy of both eyes  H35.033   9. Combined forms of age-related cataract of both eyes  H25.813    1,2. Rhegmatogenous retinal detachment, right eye - s/p laser retinopexy OD for tear/focal RD and lattice, 3.4.2020 - now, bullous superior mac off detachment, onset of foveal involvement Thursday, 07/02/19 by history - detached from 0900 to 0100 oclock, fovea off, horseshoe tear at 1100 and patches of lattice w/ retinal holes at 1030 - The incidence, risk factors, and natural history of retinal detachment was discussed with patient.  Potential treatment options including delimiting laser, pneumatic retinopexy, scleral buckle, and vitrectomy, cryotherapy and laser, and the use of air, gas, and oil discussed with patient.  The risks of blindness, loss of vision, infection, hemorrhage, cataract progression or lens displacement were discussed with patient. - patient wishes to undergo retinal detachment repair via pneumatic cryopexy OD in office today 07.23.20 - RBA of procedure discussed, questions answered - informed consent obtained and signed - see procedure note - maxitrol ung qid OD - f/u 07.25.20  3,4.Lattice degeneration w/ atrophic holes, both eyes  - patches of lattice located at 0730, 0800 and 1000 OD, 0430, 0530-0600 OS  - s/p laser retinopexy OD (03.04.20) -- good laser changes in place  - s/p laser retinopexy OS (03.12.20) -- good laser changes in place  - monitor  5. No retinal edema on exam or OCT OS  6. Diabetes  mellitus, type 2 without retinopathy  - The incidence, risk factors for progression, natural history and treatment options for diabetic retinopathy  were discussed with patient.    - The need for close monitoring of blood glucose, blood pressure, and serum lipids, avoiding cigarette or any type of tobacco, and the need for long term follow up was also discussed with patient.    7,8. Hypertensive retinopathy OU  - discussed importance of tight BP control  - monitor  9. 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  - discussed diagnosis and progression  - not yet visually significant  - monitor for now   Ophthalmic Meds Ordered this visit:  Meds ordered this encounter  Medications  . neomycin-polymyxin-dexameth (MAXITROL) 0.1 % OINT    Sig: Place 1 application into the right eye 4 (four) times daily.    Dispense:  3.5 g    Refill:  2       Return in about 2 days (around 07/04/2019) for POV.  There are no Patient Instructions on file for this visit.   Explained the diagnoses, plan, and follow up with the patient and they expressed understanding.  Patient expressed understanding of the importance of proper follow up care.      This document serves as a record of services personally performed by Gardiner Sleeper, MD, PhD. It was created on their behalf by Roselee Nova, COMT. The creation of this record is the provider's dictation and/or activities during the visit.  Electronically  signed by: Roselee Nova, COMT 07/02/19 5:57 PM    Gardiner Sleeper, M.D., Ph.D. Diseases & Surgery of the Retina and Vitreous [facility]  I have reviewed the above documentation for accuracy and completeness, and I agree with the above. Gardiner Sleeper, M.D., Ph.D. 04/06/19 5:57 PM     Abbreviations: M myopia (nearsighted); A astigmatism; H hyperopia (farsighted); P presbyopia;  Mrx spectacle prescription;  CTL contact lenses; OD right eye; OS left eye; OU both eyes  XT exotropia; ET esotropia; PEK punctate epithelial keratitis; PEE punctate epithelial erosions; DES dry eye syndrome; MGD meibomian gland dysfunction; ATs artificial tears; PFAT's preservative free artificial tears; [place] nuclear sclerotic cataract; PSC posterior subcapsular cataract; ERM epi-retinal membrane; PVD posterior vitreous detachment; RD retinal detachment; DM diabetes mellitus; DR diabetic retinopathy; NPDR non-proliferative diabetic retinopathy; PDR proliferative diabetic retinopathy; CSME clinically significant macular edema; DME diabetic macular edema; dbh dot blot hemorrhages; CWS cotton wool spot; POAG primary open angle glaucoma; C/D cup-to-disc ratio; HVF humphrey visual field; GVF goldmann visual field; OCT optical coherence tomography; IOP intraocular pressure; BRVO Branch retinal vein occlusion; CRVO central retinal vein occlusion; CRAO central retinal artery occlusion; BRAO branch retinal artery occlusion; RT retinal tear; SB scleral buckle; PPV pars plana vitrectomy; VH Vitreous hemorrhage; PRP panretinal laser photocoagulation; IVK intravitreal kenalog; VMT vitreomacular traction; MH Macular hole;  NVD neovascularization of the disc; NVE neovascularization elsewhere; AREDS age related eye disease study; ARMD age related macular degeneration; POAG primary open angle glaucoma; EBMD epithelial/anterior basement membrane dystrophy; ACIOL anterior chamber intraocular lens; IOL intraocular lens; PCIOL posterior chamber intraocular lens; Phaco/IOL phacoemulsification with intraocular lens placement; [place] photorefractive keratectomy; LASIK laser assisted in situ keratomileusis; HTN hypertension; DM diabetes mellitus; COPD chronic obstructive pulmonary disease

## 2019-07-02 NOTE — Progress Notes (Addendum)
Nancy [name]  [name]     CHIEF COMPLAINT Patient presents for Post-op Follow-up   HISTORY OF PRESENT ILLNESS: Nancy [name] is a 58 y.o. female who presents to the clinic today for:   HPI    Post-op Follow-up    In right eye.  I, the Nancy [name] physician,  performed the HPI with the patient and updated documentation appropriately.          Comments    Pt reports some improvement in vision OD. States she can still see "the fluid." Reports single bubble       Last edited by Nancy Caffey, MD on [name] 11:26 AM. (History)      Referring physician: Redmond School, MD [address] [place],  [place] [address]  HISTORICAL INFORMATION:   Selected notes from the MEDICAL RECORD NUMBER Referred by Dr. Madelin [name] for concern of retinal holes OD LEE: 02.26.20 (Nancy [name]) [BCVA: 20/20 OU] Ocular Hx- PMH-DM, HLD, sickle cell trait    CURRENT MEDICATIONS: Current Outpatient Medications (Ophthalmic Drugs)  Medication Sig  . neomycin-polymyxin-dexameth (MAXITROL) 0.1 % OINT Place 1 application into the right eye 4 (four) times daily.   No current facility-administered medications for this visit.  (Ophthalmic Drugs)   Current Outpatient Medications (Other)  Medication Sig  . albuterol (PROVENTIL HFA;VENTOLIN HFA) 108 (90 BASE) MCG/ACT inhaler Inhale 2 puffs into the lungs every 6 (six) hours as needed.  . Blood Glucose Monitoring Suppl (BLOOD GLUCOSE METER) kit Use as instructed, check sugars one time a day at least every other day.  Marland Kitchen FARXIGA 10 MG TABS tablet   . fluticasone (FLONASE) 50 MCG/ACT nasal spray fluticasone propionate 50 mcg/actuation nasal spray,suspension  . glipiZIDE (GLUCOTROL) 5 MG tablet glipizide 5 mg tablet  . Lancets (ONETOUCH DELICA PLUS [id number]) MISC USE 1 TO CHECK GLUCOSE TWICE DAILY  . lisinopril (PRINIVIL,ZESTRIL) 2.5 MG tablet lisinopril 2.5 mg tablet  . lovastatin (MEVACOR) 10 MG tablet lovastatin 10 mg  tablet  . methocarbamol (ROBAXIN) 500 MG tablet Take 1 or 2 po Q 6hrs for muscle pain (Patient not taking: Reported on 07/02/2019)  . naproxen (NAPROSYN) 250 MG tablet Take 1 po BID with food prn pain  . ONE TOUCH ULTRA TEST test strip USE 1 STRIP TO CHECK GLUCOSE ONCE TO TWICE DAILY   No current facility-administered medications for this visit.  (Other)      REVIEW OF SYSTEMS:    ALLERGIES Allergies  Allergen Reactions  . Cantaloupe (Diagnostic) Anaphylaxis  . Strawberry Extract Anaphylaxis  . Watermelon Flavor Anaphylaxis  . Metformin And Related Other (See Comments)    Lethargic, swelling of the eyes, drowsiness  . Penicillins Hives    PAST MEDICAL HISTORY Past Medical History:  Diagnosis Date  . Asthma   . Diabetes mellitus without complication ([place])   . Hypertension    Past Surgical History:  Procedure Laterality Date  . BREAST SURGERY    . REPLACEMENT TOTAL KNEE    . right foot      FAMILY HISTORY Family History  Problem Relation Age of Onset  . Anesthesia problems Other        family history   . Diabetes Other        family history   . Glaucoma Sister   . Glaucoma Brother     SOCIAL HISTORY Social History   Tobacco Use  . Smoking status: Never Smoker  . Smokeless tobacco: Never Used  Substance Use Topics  .  Alcohol use: No  . Drug use: No         OPHTHALMIC EXAM:  Base Eye Exam    Visual Acuity (Snellen - Linear)      Right Left   Dist cc 20/80 20/20   Dist ph cc 20/60 +1    Correction: Glasses       Tonometry (Tonopen, 11:25 AM)      Right Left   Pressure 10 9       Pupils      Dark Light Shape React APD   Right 2.5 2 Round Minimal -   Left 2 1 Round 2+ -       Extraocular Movement      Right Left    Full Full       Neuro/Psych    Oriented x3: Yes   Mood/Affect: Normal       Dilation    Both eyes: 1.0% Mydriacyl, 2.5% Phenylephrine @ 11:29 AM        Slit Lamp and Fundus Exam    Slit Lamp Exam      Right Left    Lids/Lashes Dermatochalasis - upper lid, Meibomian gland dysfunction Dermatochalasis - upper lid, mild Meibomian gland dysfunction   Conjunctiva/Sclera mild Melanosis; mild pneumatic chemosis; 1+ injection; mild [place] ST quad mild Melanosis   Cornea mild Arcus mild Arcus, 1+ Punctate epithelial erosions   Anterior Chamber Deep and quiet Deep and quiet   Iris Round and dilated, No NVI Round and dilated, No NVI   Lens 1+ Nuclear sclerosis,1- 2+ Cortical cataract 1+ Nuclear sclerosis, 1+ Cortical cataract   Vitreous Vitreous syneresis, +pigment, PVD, vitreous condensations inferiorly Vitreous syneresis, Posterior vitreous detachment       Fundus Exam      Right Left   Disc pink and sharp; +cupping; mild pallor    C/D Ratio 0.7 0.5   Macula Flat, SRF improved; +RPE mottling    Vessels mild Vascular attenuation, mild Copper wiring, AV crossing changes    Periphery Retina reattached with vast improvement in SRF under superior gas bubble; good early cryo changes around retinal breaks;pre-op: bullous superior temporal RD starting at 0900 to 0100. Horse shoe retinal tear with bridging vessel superiorly (1100); lattice with retinal holes at 1030,            IMAGING AND PROCEDURES  Imaging and Procedures for _0 @           ASSESSMENT/PLAN:    ICD-10-CM   1. Right retinal detachment  H33.21   2. 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 Retinal holes, bilateral  H33.323   5. Retinal edema  H35.81   6. Diabetes mellitus type 2 without retinopathy ([place])  E11.9   7. Essential hypertension  I10   8. Hypertensive retinopathy of both eyes  H35.033   9. Combined forms of age-related cataract of both eyes  H25.813    1,2. Rhegmatogenous retinal detachment, right eye - s/p laser retinopexy OD for tear/focal RD and lattice, 3.4.2020 - 7.23.2020: acute bullous superior mac off detachment, onset of foveal involvement Thursday, 07/02/19 by  history - detached from 0900 to 0100 oclock, fovea off, horseshoe tear at 1100 and patches of lattice w/ retinal holes at 1030 - s/p pneumatic cryopexy OD in office (07.23.20) - today, SRF vastly improved; good early cryo changes surrounding breaks from 1030-1100 - continue face down, left head tilt positioning - maxitrol ung qid  OD - f/u Monday, 830 am for POV, recheck with OCT  3,4.Lattice degeneration w/ atrophic holes, both eyes  - patches of lattice located at 0730, 0800 and 1000 OD, 0430, 0530-0600 OS  - s/p laser retinopexy OD (03.04.20) -- good laser changes in place  - s/p laser retinopexy OS (03.12.20) -- good laser changes in place  - monitor  5. No retinal edema on exam or OCT OS  6. Diabetes mellitus, type 2 without retinopathy  - The incidence, risk factors for progression, natural history and treatment options for diabetic retinopathy  were discussed with patient.    - The need for close monitoring of blood glucose, blood pressure, and serum lipids, avoiding cigarette or any type of tobacco, and the need for long term follow up was also discussed with patient.    7,8. Hypertensive retinopathy OU  - discussed importance of tight BP control  - monitor  9. 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  - discussed diagnosis and progression  - not yet visually significant  - monitor for now   Ophthalmic Meds Ordered this visit:  No orders of the defined types were placed in this encounter.      Return in about 2 days (around 07/06/2019) for POV, OCT.  There are no Patient Instructions on file for this visit.   Explained the diagnoses, plan, and follow up with the patient and they expressed understanding.  Patient expressed understanding of the importance of proper follow up care.     Gardiner Sleeper, NancyD., Ph.D. Diseases & Surgery of the Retina and Vitreous [facility]   Abbreviations: M myopia (nearsighted); A astigmatism; H hyperopia (farsighted); P presbyopia; Mrx spectacle prescription;  CTL contact lenses; OD right eye; OS left eye; OU both eyes  XT exotropia; ET esotropia; PEK punctate epithelial keratitis; PEE punctate epithelial erosions; DES dry eye syndrome; MGD meibomian gland dysfunction; ATs artificial tears; PFAT's preservative free artificial tears; [place] nuclear sclerotic cataract; PSC posterior subcapsular cataract; ERM epi-retinal membrane; PVD posterior vitreous detachment; RD retinal detachment; DM diabetes mellitus; DR diabetic retinopathy; NPDR non-proliferative diabetic retinopathy; PDR proliferative diabetic retinopathy; CSME clinically significant macular edema; DME diabetic macular edema; dbh dot blot hemorrhages; CWS cotton wool spot; POAG primary open angle glaucoma; C/D cup-to-disc ratio; HVF humphrey visual field; GVF goldmann visual field; OCT optical coherence tomography; IOP intraocular pressure; BRVO Branch retinal vein occlusion; CRVO central retinal vein occlusion; CRAO central retinal artery occlusion; BRAO branch retinal artery occlusion; RT retinal tear; SB scleral buckle; PPV pars plana vitrectomy; VH Vitreous hemorrhage; PRP panretinal laser photocoagulation; IVK intravitreal kenalog; VMT vitreomacular traction; MH Macular hole;  NVD neovascularization of the disc; NVE neovascularization elsewhere; AREDS age related eye disease study; ARMD age related macular degeneration; POAG primary open angle glaucoma; EBMD epithelial/anterior basement membrane dystrophy; ACIOL anterior chamber intraocular lens; IOL intraocular lens; PCIOL posterior chamber intraocular lens; Phaco/IOL phacoemulsification with intraocular lens placement; [place] photorefractive keratectomy; LASIK laser assisted in situ keratomileusis; HTN hypertension; DM diabetes mellitus; COPD chronic obstructive pulmonary disease

## 2019-07-03 ENCOUNTER — Encounter (INDEPENDENT_AMBULATORY_CARE_PROVIDER_SITE_OTHER): Payer: Self-pay | Admitting: Ophthalmology

## 2019-07-04 ENCOUNTER — Encounter (INDEPENDENT_AMBULATORY_CARE_PROVIDER_SITE_OTHER): Payer: Self-pay | Admitting: Ophthalmology

## 2019-07-04 ENCOUNTER — Ambulatory Visit (INDEPENDENT_AMBULATORY_CARE_PROVIDER_SITE_OTHER): Payer: BC Managed Care – PPO | Admitting: Ophthalmology

## 2019-07-04 DIAGNOSIS — I10 Essential (primary) hypertension: Secondary | ICD-10-CM

## 2019-07-04 DIAGNOSIS — H3581 Retinal edema: Secondary | ICD-10-CM

## 2019-07-04 DIAGNOSIS — H33323 Round hole, bilateral: Secondary | ICD-10-CM

## 2019-07-04 DIAGNOSIS — H35033 Hypertensive retinopathy, bilateral: Secondary | ICD-10-CM

## 2019-07-04 DIAGNOSIS — H35413 Lattice degeneration of retina, bilateral: Secondary | ICD-10-CM

## 2019-07-04 DIAGNOSIS — H3321 Serous retinal detachment, right eye: Secondary | ICD-10-CM

## 2019-07-04 DIAGNOSIS — E119 Type 2 diabetes mellitus without complications: Secondary | ICD-10-CM

## 2019-07-04 DIAGNOSIS — H25813 Combined forms of age-related cataract, bilateral: Secondary | ICD-10-CM

## 2019-07-04 DIAGNOSIS — H33311 Horseshoe tear of retina without detachment, right eye: Secondary | ICD-10-CM

## 2019-07-05 NOTE — Progress Notes (Signed)
[facility] Note  07/06/2019     CHIEF COMPLAINT Patient presents for Post-op Follow-up   HISTORY OF PRESENT ILLNESS: Nancy [name] is a 58 y.o. female who presents to the clinic today for:   HPI    Post-op Follow-up    In right eye.  Vision is improved.  I, the attending physician,  performed the HPI with the patient and updated documentation appropriately.          Comments    58 y/o female pt here for 2 day f/u.  S/p pneumatic retinopexy OD on 07/02/19.  VA OD continues to improve.  Can still see gas bubble inferiorly OD.  No change in [place] OS.  Denies pain, flashes, floaters.  Maxitrol ung TID OD.       Last edited by Bernarda Caffey, MD on 07/06/2019  9:15 AM. (History)      Referring physician: Redmond School, MD [address] [place],   [address]  HISTORICAL INFORMATION:   Selected notes from the MEDICAL RECORD NUMBER Referred by Dr. Madelin Headings for concern of retinal holes OD LEE: 02.26.20 (M. Cotter) [BCVA: 20/20 OU] Ocular Hx- PMH-DM, HLD, sickle cell trait    CURRENT MEDICATIONS: Current Outpatient Medications (Ophthalmic Drugs)  Medication Sig  . neomycin-polymyxin-dexameth (MAXITROL) 0.1 % OINT Place 1 application into the right eye 4 (four) times daily.   No current facility-administered medications for this visit.  (Ophthalmic Drugs)   Current Outpatient Medications (Other)  Medication Sig  . albuterol (PROVENTIL HFA;VENTOLIN HFA) 108 (90 BASE) MCG/ACT inhaler Inhale 2 puffs into the lungs every 6 (six) hours as needed.  . Blood Glucose Monitoring Suppl (BLOOD GLUCOSE METER) kit Use as instructed, check sugars one time a day at least every other day.  Marland Kitchen FARXIGA 10 MG TABS tablet   . fluticasone (FLONASE) 50 MCG/ACT nasal spray fluticasone propionate 50 mcg/actuation nasal spray,suspension  . glipiZIDE (GLUCOTROL) 5 MG tablet glipizide 5 mg tablet  . Lancets (ONETOUCH DELICA PLUS [id number]) MISC USE 1 TO CHECK  GLUCOSE TWICE DAILY  . lisinopril (PRINIVIL,ZESTRIL) 2.5 MG tablet lisinopril 2.5 mg tablet  . lovastatin (MEVACOR) 10 MG tablet lovastatin 10 mg tablet  . methocarbamol (ROBAXIN) 500 MG tablet Take 1 or 2 po Q 6hrs for muscle pain (Patient not taking: Reported on 07/02/2019)  . naproxen (NAPROSYN) 250 MG tablet Take 1 po BID with food prn pain  . ONE TOUCH ULTRA TEST test strip USE 1 STRIP TO CHECK GLUCOSE ONCE TO TWICE DAILY   No current facility-administered medications for this visit.  (Other)      REVIEW OF SYSTEMS: ROS    Positive for: Musculoskeletal, Eyes, Respiratory   Negative for: Constitutional, Gastrointestinal, Neurological, Skin, Genitourinary, HENT, Endocrine, Cardiovascular, Psychiatric, Allergic/Imm, Heme/Lymph   Last edited by Matthew Folks, COA on 07/06/2019  8:53 AM. (History)       ALLERGIES Allergies  Allergen Reactions  . Cantaloupe (Diagnostic) Anaphylaxis  . Strawberry Extract Anaphylaxis  . Watermelon Flavor Anaphylaxis  . Metformin And Related Other (See Comments)    Lethargic, swelling of the eyes, drowsiness  . Penicillins Hives    PAST MEDICAL HISTORY Past Medical History:  Diagnosis Date  . Asthma   . Cataract    OU  . Diabetes mellitus without complication ([place])   . Hypertension   . Hypertensive retinopathy    OU  . Retinal detachment 07/02/2019   OD   Past Surgical History:  Procedure Laterality Date  .  BREAST SURGERY    . REPLACEMENT TOTAL KNEE    . RETINAL DETACHMENT SURGERY Right 07/02/2019   Pneumatic retinopexy - Dr. Bernarda Caffey  . right foot      FAMILY HISTORY Family History  Problem Relation Age of Onset  . Anesthesia problems Other        family history   . Diabetes Other        family history   . Glaucoma Sister   . Glaucoma Brother     SOCIAL HISTORY Social History   Tobacco Use  . Smoking status: Never Smoker  . Smokeless tobacco: Never Used  Substance Use Topics  . Alcohol use: No  . Drug use: No          OPHTHALMIC EXAM:  Base Eye Exam    Visual Acuity (Snellen - Linear)      Right Left   Dist cc 20/50 20/20   Dist ph cc NI    Correction: Glasses       Tonometry (Tonopen, 8:56 AM)      Right Left   Pressure 12 11       Pupils      Dark Light Shape React APD   Right 3 2 Round Brisk None   Left 3 2 Round Brisk None       Visual Fields (Counting fingers)      Left Right    Full    Restrictions  Partial outer superior temporal, superior nasal deficiencies       Extraocular Movement      Right Left    Full, Ortho Full, Ortho       Neuro/Psych    Oriented x3: Yes   Mood/Affect: Normal       Dilation    Both eyes: 1.0% Mydriacyl, 2.5% Phenylephrine @ 8:56 AM        Slit Lamp and Fundus Exam    Slit Lamp Exam      Right Left   Lids/Lashes Dermatochalasis - upper lid, Meibomian gland dysfunction Dermatochalasis - upper lid, mild Meibomian gland dysfunction   Conjunctiva/Sclera mild Melanosis; mild pneumatic chemosis; 1+ injection; mild [place] ST quad mild Melanosis   Cornea mild Arcus mild Arcus, 1+ Punctate epithelial erosions   Anterior Chamber Deep and quiet Deep and quiet   Iris Round and dilated, No NVI Round and dilated, No NVI   Lens 1+ Nuclear sclerosis,1- 2+ Cortical cataract 1+ Nuclear sclerosis, 1+ Cortical cataract   Vitreous Vitreous syneresis, +pigment, PVD, pigmented vitreous condensations settling inferiorly; 25-30% gas bubble Vitreous syneresis, Posterior vitreous detachment       Fundus Exam      Right Left   Disc pink and sharp; +cupping; mild pallor    C/D Ratio 0.7 0.5   Macula Flat w/ macula reattached, SRF improved; +RPE mottling    Vessels mild Vascular attenuation, mild Copper wiring, AV crossing changes    Periphery Retina reattached with vast improvement in SRF under superior gas bubble; good early cryo changes around retinal breaks; pre-op: bullous superior temporal RD starting at 0900 to 0100. Horse shoe retinal tear with  bridging vessel superiorly (1100); lattice with retinal holes at 1030,            IMAGING AND PROCEDURES  Imaging and Procedures for _0 @  OCT, Retina - OU - Both Eyes       Right Eye Quality was good. Central Foveal Thickness: 238. Progression has improved. Findings include subretinal fluid, normal foveal contour, no IRF,  outer retinal atrophy (macula reattached with tr SRF at demarcation line).   Left Eye Quality was good. Central Foveal Thickness: 273. Progression has been stable. Findings include normal foveal contour, no SRF, no IRF.   Notes *Images captured and stored on drive  Diagnosis / Impression:  OD: macula reattached with tr SRF at demarcation line OS: NFP, no IRF/SRF  Clinical management:  See below  Abbreviations: NFP - Normal foveal profile. CME - cystoid macular edema. PED - pigment epithelial detachment. IRF - intraretinal fluid. SRF - subretinal fluid. EZ - ellipsoid zone. ERM - epiretinal membrane. ORA - outer retinal atrophy. ORT - outer retinal tubulation. SRHM - subretinal hyper-reflective material                 ASSESSMENT/PLAN:    ICD-10-CM   1. Right retinal detachment  H33.21   2. 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 Retinal holes, bilateral  H33.323   5. Retinal edema  H35.81 OCT, Retina - OU - Both Eyes  6. Diabetes mellitus type 2 without retinopathy ([place])  E11.9   7. Essential hypertension  I10   8. Hypertensive retinopathy of both eyes  H35.033   9. Combined forms of age-related cataract of both eyes  H25.813    1,2. Rhegmatogenous retinal detachment, right eye - s/p laser retinopexy OD for tear/focal RD and lattice, 3.4.2020 - 7.23.2020: acute bullous superior mac off detachment, onset of foveal involvement Thursday, 07/02/19 by history - detached from 0900 to 0100 oclock, fovea off, horseshoe tear at 1100 and patches of lattice w/ retinal holes at 1030 - s/p pneumatic  cryopexy OD in office (07.23.20) - today, SRF vastly improved; good early cryo changes surrounding breaks from 1030-1100 - continue face down, left head tilt positioning - maxitrol ung qid OD - f/u Wednesday, 07/08/19 @ 915 for POV, possible laser OD  3,4.Lattice degeneration w/ atrophic holes, both eyes  - patches of lattice located at 0730, 0800 and 1000 OD, 0430, 0530-0600 OS  - s/p laser retinopexy OD (03.04.20) -- good laser changes in place  - s/p laser retinopexy OS (03.12.20) -- good laser changes in place  - monitor  5. No retinal edema on exam or OCT OS  6. Diabetes mellitus, type 2 without retinopathy  - The incidence, risk factors for progression, natural history and treatment options for diabetic retinopathy  were discussed with patient.    - The need for close monitoring of blood glucose, blood pressure, and serum lipids, avoiding cigarette or any type of tobacco, and the need for long term follow up was also discussed with patient.    7,8. Hypertensive retinopathy OU  - discussed importance of tight BP control  - monitor  9. 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  - discussed diagnosis and progression  - not yet visually significant  - monitor for now   Ophthalmic Meds Ordered this visit:  No orders of the defined types were placed in this encounter.      Return in about 2 days (around 07/08/2019) for as scheduled.  There are no Patient Instructions on file for this visit.   Explained the diagnoses, plan, and follow up with the patient and they expressed understanding.  Patient expressed understanding of the importance of proper follow up care.    This document serves as  a record of services personally performed by Gardiner Sleeper, MD, PhD. It was created on their behalf by Ernest Mallick, OA, an ophthalmic assistant. The creation of this record is the  provider's dictation and/or activities during the visit.    Electronically signed by: Ernest Mallick, OA 07.26.2020 9:45 PM    Gardiner Sleeper, M.D., Ph.D. Diseases & Surgery of the Retina and Vitreous [facility]  I have reviewed the above documentation for accuracy and completeness, and I agree with the above. Gardiner Sleeper, M.D., Ph.D. 07/07/19 9:45 PM    Abbreviations: M myopia (nearsighted); A astigmatism; H hyperopia (farsighted); P presbyopia; Mrx spectacle prescription;  CTL contact lenses; OD right eye; OS left eye; OU both eyes  XT exotropia; ET esotropia; PEK punctate epithelial keratitis; PEE punctate epithelial erosions; DES dry eye syndrome; MGD meibomian gland dysfunction; ATs artificial tears; PFAT's preservative free artificial tears; [place] nuclear sclerotic cataract; PSC posterior subcapsular cataract; ERM epi-retinal membrane; PVD posterior vitreous detachment; RD retinal detachment; DM diabetes mellitus; DR diabetic retinopathy; NPDR non-proliferative diabetic retinopathy; PDR proliferative diabetic retinopathy; CSME clinically significant macular edema; DME diabetic macular edema; dbh dot blot hemorrhages; CWS cotton wool spot; POAG primary open angle glaucoma; C/D cup-to-disc ratio; HVF humphrey visual field; GVF goldmann visual field; OCT optical coherence tomography; IOP intraocular pressure; BRVO Branch retinal vein occlusion; CRVO central retinal vein occlusion; CRAO central retinal artery occlusion; BRAO branch retinal artery occlusion; RT retinal tear; SB scleral buckle; PPV pars plana vitrectomy; VH Vitreous hemorrhage; PRP panretinal laser photocoagulation; IVK intravitreal kenalog; VMT vitreomacular traction; MH Macular hole;  NVD neovascularization of the disc; NVE neovascularization elsewhere; AREDS age related eye disease study; ARMD age related macular degeneration; POAG primary open angle glaucoma; EBMD epithelial/anterior basement membrane  dystrophy; ACIOL anterior chamber intraocular lens; IOL intraocular lens; PCIOL posterior chamber intraocular lens; Phaco/IOL phacoemulsification with intraocular lens placement; [place] photorefractive keratectomy; LASIK laser assisted in situ keratomileusis; HTN hypertension; DM diabetes mellitus; COPD chronic obstructive pulmonary disease

## 2019-07-06 ENCOUNTER — Other Ambulatory Visit: Payer: Self-pay

## 2019-07-06 ENCOUNTER — Ambulatory Visit (INDEPENDENT_AMBULATORY_CARE_PROVIDER_SITE_OTHER): Payer: BC Managed Care – PPO | Admitting: Ophthalmology

## 2019-07-06 ENCOUNTER — Encounter (INDEPENDENT_AMBULATORY_CARE_PROVIDER_SITE_OTHER): Payer: Self-pay | Admitting: Ophthalmology

## 2019-07-06 DIAGNOSIS — H33323 Round hole, bilateral: Secondary | ICD-10-CM

## 2019-07-06 DIAGNOSIS — H35413 Lattice degeneration of retina, bilateral: Secondary | ICD-10-CM

## 2019-07-06 DIAGNOSIS — H3581 Retinal edema: Secondary | ICD-10-CM | POA: Diagnosis not present

## 2019-07-06 DIAGNOSIS — H3321 Serous retinal detachment, right eye: Secondary | ICD-10-CM

## 2019-07-06 DIAGNOSIS — H33311 Horseshoe tear of retina without detachment, right eye: Secondary | ICD-10-CM

## 2019-07-06 DIAGNOSIS — I10 Essential (primary) hypertension: Secondary | ICD-10-CM

## 2019-07-06 DIAGNOSIS — H25813 Combined forms of age-related cataract, bilateral: Secondary | ICD-10-CM

## 2019-07-06 DIAGNOSIS — E119 Type 2 diabetes mellitus without complications: Secondary | ICD-10-CM

## 2019-07-06 DIAGNOSIS — H35033 Hypertensive retinopathy, bilateral: Secondary | ICD-10-CM

## 2019-07-06 NOTE — Progress Notes (Addendum)
[facility] - Clinic Note  07/08/2019     CHIEF COMPLAINT Patient presents for Post-op Follow-up   HISTORY OF PRESENT ILLNESS: Nancy [name] is a 58 y.o. female who presents to the clinic today for:   HPI    Post-op Follow-up    In right eye.  Discomfort includes none.  Vision is stable.  I, the attending physician,  performed the HPI with the patient and updated documentation appropriately.          Comments    Patient states she has tunnel vision and some double vision due to status of OD.  Patient denies eye pain or discomfort and denies any new or worsening floaters or fol OU.       Last edited by Zamora, Brian, MD on 07/08/2019 10:00 AM. (History)      Referring physician: Fusco, Lawrence, MD [address] [place],  [place] [address]  HISTORICAL INFORMATION:   Selected notes from the medical record:  Referred by Dr. Mark Cotter for concern of retinal holes OD LEE: 02.26.20 (M. Cotter) [BCVA: 20/20 OU] Ocular Hx- PMH-DM, HLD, sickle cell trait    CURRENT MEDICATIONS: Current Outpatient Medications (Ophthalmic Drugs)  Medication Sig  . neomycin-polymyxin-dexameth (MAXITROL) 0.1 % OINT Place 1 application into the right eye 4 (four) times daily.   No current facility-administered medications for this visit.  (Ophthalmic Drugs)   Current Outpatient Medications (Other)  Medication Sig  . albuterol (PROVENTIL HFA;VENTOLIN HFA) 108 (90 BASE) MCG/ACT inhaler Inhale 2 puffs into the lungs every 6 (six) hours as needed.  . Blood Glucose Monitoring Suppl (BLOOD GLUCOSE METER) kit Use as instructed, check sugars one time a day at least every other day.  . FARXIGA 10 MG TABS tablet   . fluticasone (FLONASE) 50 MCG/ACT nasal spray fluticasone propionate 50 mcg/actuation nasal spray,suspension  . glipiZIDE (GLUCOTROL) 5 MG tablet glipizide 5 mg tablet  . Lancets (ONETOUCH DELICA PLUS LANCET33G) MISC USE 1 TO CHECK GLUCOSE TWICE DAILY  .  lisinopril (PRINIVIL,ZESTRIL) 2.5 MG tablet lisinopril 2.5 mg tablet  . lovastatin (MEVACOR) 10 MG tablet lovastatin 10 mg tablet  . methocarbamol (ROBAXIN) 500 MG tablet Take 1 or 2 po Q 6hrs for muscle pain (Patient not taking: Reported on 07/02/2019)  . naproxen (NAPROSYN) 250 MG tablet Take 1 po BID with food prn pain  . ONE TOUCH ULTRA TEST test strip USE 1 STRIP TO CHECK GLUCOSE ONCE TO TWICE DAILY   No current facility-administered medications for this visit.  (Other)      REVIEW OF SYSTEMS: ROS    Positive for: Musculoskeletal, Eyes, Respiratory   Negative for: Constitutional, Gastrointestinal, Neurological, Skin, Genitourinary, HENT, Endocrine, Cardiovascular, Psychiatric, Allergic/Imm, Heme/Lymph   Last edited by English, Ashley L on 07/08/2019  9:38 AM. (History)       ALLERGIES Allergies  Allergen Reactions  . Cantaloupe (Diagnostic) Anaphylaxis  . Strawberry Extract Anaphylaxis  . Watermelon Flavor Anaphylaxis  . Metformin And Related Other (See Comments)    Lethargic, swelling of the eyes, drowsiness  . Penicillins Hives    PAST MEDICAL HISTORY Past Medical History:  Diagnosis Date  . Asthma   . Cataract    OU  . Diabetes mellitus without complication (HCC)   . Hypertension   . Hypertensive retinopathy    OU  . Retinal detachment 07/02/2019   OD   Past Surgical History:  Procedure Laterality Date  . BREAST SURGERY    . REPLACEMENT TOTAL KNEE    .   RETINAL DETACHMENT SURGERY Right 07/02/2019   Pneumatic retinopexy - Dr. Brian Zamora  . right foot      FAMILY HISTORY Family History  Problem Relation Age of Onset  . Anesthesia problems Other        family history   . Diabetes Other        family history   . Glaucoma Sister   . Glaucoma Brother     SOCIAL HISTORY Social History   Tobacco Use  . Smoking status: Never Smoker  . Smokeless tobacco: Never Used  Substance Use Topics  . Alcohol use: No  . Drug use: No         OPHTHALMIC  EXAM:  Base Eye Exam    Visual Acuity (Snellen - Linear)      Right Left   Dist cc 20/50 -2 20/20   Dist ph cc NI    Correction: Glasses       Tonometry (Tonopen, 9:44 AM)      Right Left   Pressure 15 14       Pupils      Dark Light Shape React APD   Right 4 3 Round Brisk 0   Left 3 2 Round Brisk 0       Visual Fields      Left Right    Full    Restrictions  Partial outer superior temporal, inferior temporal, superior nasal, inferior nasal deficiencies       Extraocular Movement      Right Left    Full Full       Neuro/Psych    Oriented x3: Yes   Mood/Affect: Normal       Dilation    Both eyes: 1.0% Mydriacyl, 2.5% Phenylephrine @ 9:44 AM        Slit Lamp and Fundus Exam    Slit Lamp Exam      Right Left   Lids/Lashes Dermatochalasis - upper lid, Meibomian gland dysfunction Dermatochalasis - upper lid, mild Meibomian gland dysfunction   Conjunctiva/Sclera mild Melanosis; mild pneumatic chemosis; 1+ injection; mild SCH ST quad mild Melanosis   Cornea mild Arcus mild Arcus, 1+ Punctate epithelial erosions   Anterior Chamber Deep and quiet Deep and quiet   Iris Round and dilated, No NVI Round and dilated, No NVI   Lens 1+ Nuclear sclerosis,1- 2+ Cortical cataract 1+ Nuclear sclerosis, 1+ Cortical cataract   Vitreous Vitreous syneresis, +pigment, PVD, pigmented vitreous condensations settling inferiorly; 20-25% gas bubble Vitreous syneresis, Posterior vitreous detachment       Fundus Exam      Right Left   Disc pink and sharp; +cupping; mild pallor Pink and Sharp   C/D Ratio 0.7 0.5   Macula Flat w/ macula reattached, SRF improved; +demarcation line inf macula; +RPE mottling Flat, Good foveal reflex, mild Retinal pigment epithelial mottling, No heme or edema   Vessels mild Vascular attenuation, mild Copper wiring, AV crossing changes Vascular attenuation, mild Copper wiring, AV crossing changes   Periphery Retina reattached with vast improvement in SRF under  superior gas bubble (20-25% remaining); good early cryo changes around retinal breaks; pre-op: bullous superior temporal RD starting at 0900 to 0100. Horse shoe retinal tear with bridging vessel superiorly (1100); lattice with retinal holes at 1030,  Attached, pigmented lattice at 0430-0630 - good laser changes surrounding patches of lattice          Refraction    Wearing Rx      Sphere Cylinder Axis   Right -  6.25 +1.50 093   Left -5.25 +1.25 090   Type: SVL          IMAGING AND PROCEDURES  Imaging and Procedures for _0 @  OCT, Retina - OU - Both Eyes       Right Eye Quality was good. Central Foveal Thickness: 251. Progression has been stable. Findings include subretinal fluid, normal foveal contour, no IRF, outer retinal atrophy (Interval improvement in SRF With tr SRF at demarcation line).   Left Eye Quality was good. Central Foveal Thickness: 278. Progression has been stable. Findings include normal foveal contour, no SRF, no IRF.   Notes *Images captured and stored on drive  Diagnosis / Impression:  OD: macula reattached with tr SRF at demarcation line OS: NFP, no IRF/SRF  Clinical management:  See below  Abbreviations: NFP - Normal foveal profile. CME - cystoid macular edema. PED - pigment epithelial detachment. IRF - intraretinal fluid. SRF - subretinal fluid. EZ - ellipsoid zone. ERM - epiretinal membrane. ORA - outer retinal atrophy. ORT - outer retinal tubulation. SRHM - subretinal hyper-reflective material         LASER PROCEDURE NOTE  Procedure:  Supplemental barrier laser retinopexy using laser indirect ophthalmoscope, RIGHT eye   Diagnosis:   Retinal detachment, RIGHT eye, s/p pneumatic cryopexy                     superotemporal RD (reattached) w/ lattice and holes at 1030, HST at 1100, anterior to equator   Surgeon: Bernarda Caffey, MD, PhD  Anesthesia: Topical  Informed consent obtained, operative eye marked, and time out performed prior to  initiation of laser.   Laser settings:  Lumenis [id number] laser indirect ophthalmoscope Power: 270 mW Duration: 50-70 msec  # spots: 305  Placement of laser: Laser was placed in three confluent rows early cryo scars, lattice and retinal tears in ST quadrant, anterior to equator.  Complications: None.  Patient tolerated the procedure well and received written and verbal post-procedure care information/education.         ASSESSMENT/PLAN:    ICD-10-CM   1. Right retinal detachment  H33.21   2. 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 Retinal holes, bilateral  H33.323   5. Retinal edema  H35.81 OCT, Retina - OU - Both Eyes  6. Diabetes mellitus type 2 without retinopathy ([place])  E11.9   7. Essential hypertension  I10   8. Hypertensive retinopathy of both eyes  H35.033   9. Combined forms of age-related cataract of both eyes  H25.813     1,2. Rhegmatogenous retinal detachment, right eye - s/p laser retinopexy OD for tear/focal RD and lattice, 3.4.2020 - 7.23.2020: acute bullous superior mac off detachment, onset of foveal involvement Thursday, 07/02/19 by history - detached from 0900 to 0100 oclock, fovea off, horseshoe tear at 1100 and patches of lattice w/ retinal holes at 1030 - s/p pneumatic cryopexy OD in office (07.23.20) - today, SRF vastly improved; good early cryo changes surrounding breaks from 1030-1100 - recommend supplemental laser retinopexy today, 7.29.2020 - RBA of procedure discussed, questions answered - informed consent obtained and signed - see procedure note - continue face down, left head tilt positioning - start Lotemax SM QID OD x7 days - cont maxitrol ung qid OD - f/u Monday, 8.3.2020  3,4.Lattice degeneration w/ atrophic holes, both eyes  - patches of lattice located at 0730, 0800 and 1000 OD, 0430, 0530-0600  OS  - s/p laser retinopexy OD (03.04.20) -- good laser changes in place  - s/p laser  retinopexy OS (03.12.20) -- good laser changes in place  - monitor  5. No retinal edema on exam or OCT OS  6. Diabetes mellitus, type 2 without retinopathy  - The incidence, risk factors for progression, natural history and treatment options for diabetic retinopathy  were discussed with patient.    - The need for close monitoring of blood glucose, blood pressure, and serum lipids, avoiding cigarette or any type of tobacco, and the need for long term follow up was also discussed with patient.    7,8. Hypertensive retinopathy OU  - discussed importance of tight BP control  - monitor  9. 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  - discussed diagnosis and progression  - not yet visually significant  - monitor for now   Ophthalmic Meds Ordered this visit:  No orders of the defined types were placed in this encounter.      Return in about 5 days (around 07/13/2019) for POV.  There are no Patient Instructions on file for this visit.   Explained the diagnoses, plan, and follow up with the patient and they expressed understanding.  Patient expressed understanding of the importance of proper follow up care.   This document serves as a record of services personally performed by Brian G. Zamora, MD, PhD. It was created on their behalf by Amanda Brown, OA, an ophthalmic assistant. The creation of this record is the provider's dictation and/or activities during the visit.    Electronically signed by: Amanda Brown, OA 07.27.2020 11:39 AM    Brian G. Zamora, M.D., Ph.D. Diseases & Surgery of the Retina and Vitreous [facility]  I have reviewed the above documentation for accuracy and completeness, and I agree with the above. Brian G. Zamora, M.D., Ph.D. 07/08/19 11:39 AM    Abbreviations: M myopia (nearsighted); A astigmatism; H hyperopia (farsighted); P presbyopia;  Mrx spectacle prescription;  CTL contact lenses; OD right eye; OS left eye; OU both eyes  XT exotropia; ET esotropia; PEK punctate epithelial keratitis; PEE punctate epithelial erosions; DES dry eye syndrome; MGD meibomian gland dysfunction; ATs artificial tears; PFAT's preservative free artificial tears; NSC nuclear sclerotic cataract; PSC posterior subcapsular cataract; ERM epi-retinal membrane; PVD posterior vitreous detachment; RD retinal detachment; DM diabetes mellitus; DR diabetic retinopathy; NPDR non-proliferative diabetic retinopathy; PDR proliferative diabetic retinopathy; CSME clinically significant macular edema; DME diabetic macular edema; dbh dot blot hemorrhages; CWS cotton wool spot; POAG primary open angle glaucoma; C/D cup-to-disc ratio; HVF humphrey visual field; GVF goldmann visual field; OCT optical coherence tomography; IOP intraocular pressure; BRVO Branch retinal vein occlusion; CRVO central retinal vein occlusion; CRAO central retinal artery occlusion; BRAO branch retinal artery occlusion; RT retinal tear; SB scleral buckle; PPV pars plana vitrectomy; VH Vitreous hemorrhage; PRP panretinal laser photocoagulation; IVK intravitreal kenalog; VMT vitreomacular traction; MH Macular hole;  NVD neovascularization of the disc; NVE neovascularization elsewhere; AREDS age related eye disease study; ARMD age related macular degeneration; POAG primary open angle glaucoma; EBMD epithelial/anterior basement membrane dystrophy; ACIOL anterior chamber intraocular lens; IOL intraocular lens; PCIOL posterior chamber intraocular lens; Phaco/IOL phacoemulsification with intraocular lens placement; PRK photorefractive keratectomy; LASIK laser assisted in situ keratomileusis; HTN hypertension; DM diabetes mellitus; COPD chronic obstructive pulmonary disease  

## 2019-07-08 ENCOUNTER — Encounter (INDEPENDENT_AMBULATORY_CARE_PROVIDER_SITE_OTHER): Payer: Self-pay | Admitting: Ophthalmology

## 2019-07-08 ENCOUNTER — Other Ambulatory Visit: Payer: Self-pay

## 2019-07-08 ENCOUNTER — Ambulatory Visit (INDEPENDENT_AMBULATORY_CARE_PROVIDER_SITE_OTHER): Payer: BC Managed Care – PPO | Admitting: Ophthalmology

## 2019-07-08 DIAGNOSIS — I10 Essential (primary) hypertension: Secondary | ICD-10-CM

## 2019-07-08 DIAGNOSIS — H33311 Horseshoe tear of retina without detachment, right eye: Secondary | ICD-10-CM

## 2019-07-08 DIAGNOSIS — H3581 Retinal edema: Secondary | ICD-10-CM | POA: Diagnosis not present

## 2019-07-08 DIAGNOSIS — H33323 Round hole, bilateral: Secondary | ICD-10-CM

## 2019-07-08 DIAGNOSIS — H35033 Hypertensive retinopathy, bilateral: Secondary | ICD-10-CM

## 2019-07-08 DIAGNOSIS — H25813 Combined forms of age-related cataract, bilateral: Secondary | ICD-10-CM

## 2019-07-08 DIAGNOSIS — H35413 Lattice degeneration of retina, bilateral: Secondary | ICD-10-CM

## 2019-07-08 DIAGNOSIS — H3321 Serous retinal detachment, right eye: Secondary | ICD-10-CM

## 2019-07-08 DIAGNOSIS — E119 Type 2 diabetes mellitus without complications: Secondary | ICD-10-CM

## 2019-07-12 NOTE — Progress Notes (Signed)
[facility] Note  07/13/2019     CHIEF COMPLAINT Patient presents for Post-op Follow-up   HISTORY OF PRESENT ILLNESS: Nancy [name] is a 58 y.o. female who presents to the clinic today for:   HPI    Post-op Follow-up    In right eye.  Discomfort includes pain.  Vision is stable.  I, the attending physician,  performed the HPI with the patient and updated documentation appropriately.          Comments    S/p pneumatic cryo OD 07/02/19 Patient states vision is the same.  She had some slight pain recently in the right eye that she treated with PO tylenol.  Patient denies any new or worsening floaters or fol OU.       Last edited by Bernarda Caffey, MD on 07/13/2019  9:36 AM. (History)    pt states the gas bubble is shrinking  Referring physician: Redmond School, MD [place],  [place] [address]  HISTORICAL INFORMATION:   Selected notes from the MEDICAL RECORD NUMBER Referred by Dr. Madelin Headings for concern of retinal holes OD LEE: 02.26.20 (M. Cotter) [BCVA: 20/20 OU] Ocular Hx- PMH-DM, HLD, sickle cell trait    CURRENT MEDICATIONS: Current Outpatient Medications (Ophthalmic Drugs)  Medication Sig  . neomycin-polymyxin-dexameth (MAXITROL) 0.1 % OINT Place 1 application into the right eye 4 (four) times daily.   No current facility-administered medications for this visit.  (Ophthalmic Drugs)   Current Outpatient Medications (Other)  Medication Sig  . albuterol (PROVENTIL HFA;VENTOLIN HFA) 108 (90 BASE) MCG/ACT inhaler Inhale 2 puffs into the lungs every 6 (six) hours as needed.  . Blood Glucose Monitoring Suppl (BLOOD GLUCOSE METER) kit Use as instructed, check sugars one time a day at least every other day.  Marland Kitchen FARXIGA 10 MG TABS tablet   . fluticasone (FLONASE) 50 MCG/ACT nasal spray fluticasone propionate 50 mcg/actuation nasal spray,suspension  . glipiZIDE (GLUCOTROL) 5 MG tablet glipizide 5 mg tablet  . Lancets (ONETOUCH  DELICA PLUS [id number]) MISC USE 1 TO CHECK GLUCOSE TWICE DAILY  . lisinopril (PRINIVIL,ZESTRIL) 2.5 MG tablet lisinopril 2.5 mg tablet  . lovastatin (MEVACOR) 10 MG tablet lovastatin 10 mg tablet  . methocarbamol (ROBAXIN) 500 MG tablet Take 1 or 2 po Q 6hrs for muscle pain (Patient not taking: Reported on 07/02/2019)  . naproxen (NAPROSYN) 250 MG tablet Take 1 po BID with food prn pain  . ONE TOUCH ULTRA TEST test strip USE 1 STRIP TO CHECK GLUCOSE ONCE TO TWICE DAILY   No current facility-administered medications for this visit.  (Other)      REVIEW OF SYSTEMS: ROS    Positive for: Musculoskeletal, Eyes, Respiratory   Negative for: Constitutional, Gastrointestinal, Neurological, Skin, Genitourinary, HENT, Endocrine, Cardiovascular, Psychiatric, Allergic/Imm, Heme/Lymph   Last edited by Doneen Poisson on 07/13/2019  9:05 AM. (History)       ALLERGIES Allergies  Allergen Reactions  . Cantaloupe (Diagnostic) Anaphylaxis  . Strawberry Extract Anaphylaxis  . Watermelon Flavor Anaphylaxis  . Metformin And Related Other (See Comments)    Lethargic, swelling of the eyes, drowsiness  . Penicillins Hives    PAST MEDICAL HISTORY Past Medical History:  Diagnosis Date  . Asthma   . Cataract    OU  . Diabetes mellitus without complication ([place])   . Hypertension   . Hypertensive retinopathy    OU  . Retinal detachment 07/02/2019   OD   Past Surgical History:  Procedure Laterality  Date  . BREAST SURGERY    . REPLACEMENT TOTAL KNEE    . RETINAL DETACHMENT SURGERY Right 07/02/2019   Pneumatic retinopexy - Dr. Bernarda Caffey  . right foot      FAMILY HISTORY Family History  Problem Relation Age of Onset  . Anesthesia problems Other        family history   . Diabetes Other        family history   . Glaucoma Sister   . Glaucoma Brother     SOCIAL HISTORY Social History   Tobacco Use  . Smoking status: Never Smoker  . Smokeless tobacco: Never Used  Substance Use  Topics  . Alcohol use: No  . Drug use: No         OPHTHALMIC EXAM:  Base Eye Exam    Visual Acuity (Snellen - Linear)      Right Left   Dist cc 20/50 -1 20/20   Correction: Glasses       Tonometry (Tonopen, 9:09 AM)      Right Left   Pressure 16 16       Pupils      Dark Light Shape React APD   Right 3 2 Round Brisk 0   Left 3 2 Round Brisk 0       Extraocular Movement      Right Left    Full Full       Neuro/Psych    Oriented x3: Yes   Mood/Affect: Normal       Dilation    Both eyes: 1.0% Mydriacyl, 2.5% Phenylephrine @ 9:10 AM        Slit Lamp and Fundus Exam    Slit Lamp Exam      Right Left   Lids/Lashes Dermatochalasis - upper lid, Meibomian gland dysfunction Dermatochalasis - upper lid, mild Meibomian gland dysfunction   Conjunctiva/Sclera mild Melanosis; mild pneumatic chemosis; 1+ injection; mild [place] ST quad mild Melanosis   Cornea mild Arcus mild Arcus, 1+ Punctate epithelial erosions   Anterior Chamber Deep and quiet Deep and quiet   Iris Round and dilated, No NVI Round and dilated, No NVI   Lens 1+ Nuclear sclerosis,1- 2+ Cortical cataract 1+ Nuclear sclerosis, 1+ Cortical cataract   Vitreous Vitreous syneresis, +pigment, PVD, pigmented vitreous condensations settling inferiorly; 20-25% gas bubble Vitreous syneresis, Posterior vitreous detachment       Fundus Exam      Right Left   Disc pink and sharp; +cupping; mild pallor Pink and Sharp   C/D Ratio 0.7 0.5   Macula Flat w/ macula reattached, good foveal reflex, SRF improved; +demarcation line inf macula; +RPE mottling Flat, Good foveal reflex, mild Retinal pigment epithelial mottling, No heme or edema   Vessels mild Vascular attenuation, mild Copper wiring, AV crossing changes Vascular attenuation, mild Copper wiring, AV crossing changes   Periphery Retina reattached with vast improvement in SRF under superior gas bubble (20-25% remaining); good early cryo changes around retinal breaks; good  early laser change SN quadrant, pre-op: bullous superior temporal RD starting at 0900 to 0100. Horse shoe retinal tear with bridging vessel superiorly (1100); lattice with retinal holes at 1030,  Attached, pigmented lattice at 3267-1245 - good laser changes surrounding patches of lattice          Refraction    Wearing Rx      Sphere Cylinder Axis   Right -6.25 +1.50 093   Left -5.25 +1.25 090   Type: SVL  IMAGING AND PROCEDURES  Imaging and Procedures for _0 @  OCT, Retina - OU - Both Eyes       Right Eye Quality was good. Central Foveal Thickness: 248. Progression has been stable. Findings include subretinal fluid, normal foveal contour, no IRF, outer retinal atrophy (tr SRF at demarcation line -- stable).   Left Eye Quality was good. Central Foveal Thickness: 274. Progression has been stable. Findings include normal foveal contour, no SRF, no IRF.   Notes *Images captured and stored on drive  Diagnosis / Impression:  OD: macula reattached with tr SRF at demarcation line -- stable OS: NFP, no IRF/SRF  Clinical management:  See below  Abbreviations: NFP - Normal foveal profile. CME - cystoid macular edema. PED - pigment epithelial detachment. IRF - intraretinal fluid. SRF - subretinal fluid. EZ - ellipsoid zone. ERM - epiretinal membrane. ORA - outer retinal atrophy. ORT - outer retinal tubulation. SRHM - subretinal hyper-reflective material               ASSESSMENT/PLAN:    ICD-10-CM   1. Right retinal detachment  H33.21   2. 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 Retinal holes, bilateral  H33.323   5. Retinal edema  H35.81 OCT, Retina - OU - Both Eyes  6. Diabetes mellitus type 2 without retinopathy ([place])  E11.9   7. Essential hypertension  I10   8. Hypertensive retinopathy of both eyes  H35.033   9. Combined forms of age-related cataract of both eyes  H25.813     1,2. Rhegmatogenous retinal  detachment, right eye  - s/p laser retinopexy OD for tear/focal RD and lattice (3.4.20)  - 7.23.2020: acute bullous superior mac off detachment, onset of foveal involvement Thursday, 07/02/19 by history  - detached from 0900 to 0100 oclock, fovea off, horseshoe tear at 1100 and patches of lattice w/ retinal holes at 1030  - s/p pneumatic cryopexy OD in office (07.23.20) -- good cryo changes surrounding breaks from 1030-1100  - supplemental fill-in laser (07.29.20) - good early laser changes  - gas bubble still 20-25%  - tr residual SRF at demarcation stable to slightly improved  - keep head upright while awake, sleep on left side until next visit  - cont Lotemax SM QID OD until Wednesday (08.05.20)  - cont maxitrol ung qid OD as needed  - f/u 1 week  3,4.Lattice degeneration w/ atrophic holes, both eyes  - patches of lattice located at 0730, 0800 and 1000 OD, 0430, 0530-0600 OS  - s/p laser retinopexy OD (03.04.20) -- good laser changes in place  - s/p laser retinopexy OS (03.12.20) -- good laser changes in place  - monitor  5. No retinal edema on exam or OCT OS  6. Diabetes mellitus, type 2 without retinopathy  - The incidence, risk factors for progression, natural history and treatment options for diabetic retinopathy  were discussed with patient.    - The need for close monitoring of blood glucose, blood pressure, and serum lipids, avoiding cigarette or any type of tobacco, and the need for long term follow up was also discussed with patient.  7,8. Hypertensive retinopathy OU  - discussed importance of tight BP control  - monitor  9. 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  - discussed diagnosis and progression  - not yet visually significant  -  monitor for now   Ophthalmic Meds Ordered this visit:  No orders of the defined types were placed in this encounter.      Return  in about 1 week (around 07/20/2019) for f/u RD OD, DFE, OCT.  There are no Patient Instructions on file for this visit.   Explained the diagnoses, plan, and follow up with the patient and they expressed understanding.  Patient expressed understanding of the importance of proper follow up care.   This document serves as a record of services personally performed by Gardiner Sleeper, MD, PhD. It was created on their behalf by Ernest Mallick, OA, an ophthalmic assistant. The creation of this record is the provider's dictation and/or activities during the visit.    Electronically signed by: Ernest Mallick, OA 08.02.2020 1:02 PM    Gardiner Sleeper, M.D., Ph.D. Diseases & Surgery of the Retina and Vitreous [facility]  I have reviewed the above documentation for accuracy and completeness, and I agree with the above. Gardiner Sleeper, M.D., Ph.D. 07/13/19 1:04 PM    Abbreviations: M myopia (nearsighted); A astigmatism; H hyperopia (farsighted); P presbyopia; Mrx spectacle prescription;  CTL contact lenses; OD right eye; OS left eye; OU both eyes  XT exotropia; ET esotropia; PEK punctate epithelial keratitis; PEE punctate epithelial erosions; DES dry eye syndrome; MGD meibomian gland dysfunction; ATs artificial tears; PFAT's preservative free artificial tears; [place] nuclear sclerotic cataract; PSC posterior subcapsular cataract; ERM epi-retinal membrane; PVD posterior vitreous detachment; RD retinal detachment; DM diabetes mellitus; DR diabetic retinopathy; NPDR non-proliferative diabetic retinopathy; PDR proliferative diabetic retinopathy; CSME clinically significant macular edema; DME diabetic macular edema; dbh dot blot hemorrhages; CWS cotton wool spot; POAG primary open angle glaucoma; C/D cup-to-disc ratio; HVF humphrey visual field; GVF goldmann visual field; OCT optical coherence tomography; IOP intraocular pressure; BRVO Branch retinal vein occlusion; CRVO central retinal vein  occlusion; CRAO central retinal artery occlusion; BRAO branch retinal artery occlusion; RT retinal tear; SB scleral buckle; PPV pars plana vitrectomy; VH Vitreous hemorrhage; PRP panretinal laser photocoagulation; IVK intravitreal kenalog; VMT vitreomacular traction; MH Macular hole;  NVD neovascularization of the disc; NVE neovascularization elsewhere; AREDS age related eye disease study; ARMD age related macular degeneration; POAG primary open angle glaucoma; EBMD epithelial/anterior basement membrane dystrophy; ACIOL anterior chamber intraocular lens; IOL intraocular lens; PCIOL posterior chamber intraocular lens; Phaco/IOL phacoemulsification with intraocular lens placement; [place] photorefractive keratectomy; LASIK laser assisted in situ keratomileusis; HTN hypertension; DM diabetes mellitus; COPD chronic obstructive pulmonary disease

## 2019-07-13 ENCOUNTER — Ambulatory Visit (INDEPENDENT_AMBULATORY_CARE_PROVIDER_SITE_OTHER): Payer: BC Managed Care – PPO | Admitting: Ophthalmology

## 2019-07-13 ENCOUNTER — Encounter (INDEPENDENT_AMBULATORY_CARE_PROVIDER_SITE_OTHER): Payer: Self-pay | Admitting: Ophthalmology

## 2019-07-13 ENCOUNTER — Other Ambulatory Visit: Payer: Self-pay

## 2019-07-13 DIAGNOSIS — H35413 Lattice degeneration of retina, bilateral: Secondary | ICD-10-CM

## 2019-07-13 DIAGNOSIS — H33311 Horseshoe tear of retina without detachment, right eye: Secondary | ICD-10-CM

## 2019-07-13 DIAGNOSIS — H3321 Serous retinal detachment, right eye: Secondary | ICD-10-CM

## 2019-07-13 DIAGNOSIS — H3581 Retinal edema: Secondary | ICD-10-CM

## 2019-07-13 DIAGNOSIS — H33323 Round hole, bilateral: Secondary | ICD-10-CM

## 2019-07-13 DIAGNOSIS — H25813 Combined forms of age-related cataract, bilateral: Secondary | ICD-10-CM

## 2019-07-13 DIAGNOSIS — I10 Essential (primary) hypertension: Secondary | ICD-10-CM

## 2019-07-13 DIAGNOSIS — E119 Type 2 diabetes mellitus without complications: Secondary | ICD-10-CM

## 2019-07-13 DIAGNOSIS — H35033 Hypertensive retinopathy, bilateral: Secondary | ICD-10-CM

## 2019-07-20 ENCOUNTER — Encounter (INDEPENDENT_AMBULATORY_CARE_PROVIDER_SITE_OTHER): Payer: Self-pay | Admitting: Ophthalmology

## 2019-07-20 ENCOUNTER — Other Ambulatory Visit: Payer: Self-pay

## 2019-07-20 ENCOUNTER — Ambulatory Visit (INDEPENDENT_AMBULATORY_CARE_PROVIDER_SITE_OTHER): Payer: BC Managed Care – PPO | Admitting: Ophthalmology

## 2019-07-20 DIAGNOSIS — E119 Type 2 diabetes mellitus without complications: Secondary | ICD-10-CM

## 2019-07-20 DIAGNOSIS — H33323 Round hole, bilateral: Secondary | ICD-10-CM

## 2019-07-20 DIAGNOSIS — H3321 Serous retinal detachment, right eye: Secondary | ICD-10-CM

## 2019-07-20 DIAGNOSIS — H3581 Retinal edema: Secondary | ICD-10-CM

## 2019-07-20 DIAGNOSIS — H25813 Combined forms of age-related cataract, bilateral: Secondary | ICD-10-CM

## 2019-07-20 DIAGNOSIS — H33311 Horseshoe tear of retina without detachment, right eye: Secondary | ICD-10-CM

## 2019-07-20 DIAGNOSIS — H35413 Lattice degeneration of retina, bilateral: Secondary | ICD-10-CM

## 2019-07-20 DIAGNOSIS — I10 Essential (primary) hypertension: Secondary | ICD-10-CM

## 2019-07-20 DIAGNOSIS — H35033 Hypertensive retinopathy, bilateral: Secondary | ICD-10-CM

## 2019-07-20 NOTE — Progress Notes (Signed)
[facility] Note  07/20/2019     CHIEF COMPLAINT Patient presents for Post-op Follow-up   HISTORY OF PRESENT ILLNESS: Nancy [name] is a 58 y.o. female who presents to the clinic today for:   HPI    Post-op Follow-up    In right eye.  Discomfort includes pain.  Vision is stable.  I, the attending physician,  performed the HPI with the patient and updated documentation appropriately.          Comments    Patient states VA is stable.  She complains of pain above right brow and states it radiates into scalp.  Patient denies any new or worsening floaters or fol OU.       Last edited by Bernarda Caffey, MD on 07/20/2019 11:45 PM. (History)    pt states the gas bubble is still there, she states she has a slight headache today and she is a little dizzy every once in awhile, pt states she has not been laying on her back, she states she ran out of the steroid drop on Friday or Saturday  Referring physician: Redmond School, [place],  [place] [address]  HISTORICAL INFORMATION:   Selected notes from the MEDICAL RECORD NUMBER Referred by Dr. Madelin Headings for concern of retinal holes OD LEE: 02.26.20 (M. Cotter) [BCVA: 20/20 OU] Ocular Hx- PMH-DM, HLD, sickle cell trait    CURRENT MEDICATIONS: Current Outpatient Medications (Ophthalmic Drugs)  Medication Sig  . neomycin-polymyxin-dexameth (MAXITROL) 0.1 % OINT Place 1 application into the right eye 4 (four) times daily.   No current facility-administered medications for this visit.  (Ophthalmic Drugs)   Current Outpatient Medications (Other)  Medication Sig  . albuterol (PROVENTIL HFA;VENTOLIN HFA) 108 (90 BASE) MCG/ACT inhaler Inhale 2 puffs into the lungs every 6 (six) hours as needed.  . Blood Glucose Monitoring Suppl (BLOOD GLUCOSE METER) kit Use as instructed, check sugars one time a day at least every other day.  Marland Kitchen FARXIGA 10 MG TABS tablet   . fluticasone (FLONASE) 50 MCG/ACT  nasal spray fluticasone propionate 50 mcg/actuation nasal spray,suspension  . glipiZIDE (GLUCOTROL) 5 MG tablet glipizide 5 mg tablet  . Lancets (ONETOUCH DELICA PLUS [id number]) MISC USE 1 TO CHECK GLUCOSE TWICE DAILY  . lisinopril (PRINIVIL,ZESTRIL) 2.5 MG tablet lisinopril 2.5 mg tablet  . lovastatin (MEVACOR) 10 MG tablet lovastatin 10 mg tablet  . methocarbamol (ROBAXIN) 500 MG tablet Take 1 or 2 po Q 6hrs for muscle pain (Patient not taking: Reported on 07/02/2019)  . naproxen (NAPROSYN) 250 MG tablet Take 1 po BID with food prn pain  . ONE TOUCH ULTRA TEST test strip USE 1 STRIP TO CHECK GLUCOSE ONCE TO TWICE DAILY   No current facility-administered medications for this visit.  (Other)      REVIEW OF SYSTEMS: ROS    Positive for: Musculoskeletal, Eyes, Respiratory   Negative for: Constitutional, Gastrointestinal, Neurological, Skin, Genitourinary, HENT, Endocrine, Cardiovascular, Psychiatric, Allergic/Imm, Heme/Lymph   Last edited by Doneen Poisson on 07/20/2019  2:40 PM. (History)       ALLERGIES Allergies  Allergen Reactions  . Cantaloupe (Diagnostic) Anaphylaxis  . Strawberry Extract Anaphylaxis  . Watermelon Flavor Anaphylaxis  . Metformin And Related Other (See Comments)    Lethargic, swelling of the eyes, drowsiness  . Penicillins Hives    PAST MEDICAL HISTORY Past Medical History:  Diagnosis Date  . Asthma   . Cataract    OU  . Diabetes mellitus  without complication ([place])   . Hypertension   . Hypertensive retinopathy    OU  . Retinal detachment 07/02/2019   OD   Past Surgical History:  Procedure Laterality Date  . BREAST SURGERY    . REPLACEMENT TOTAL KNEE    . RETINAL DETACHMENT SURGERY Right 07/02/2019   Pneumatic retinopexy - Dr. Bernarda Caffey  . right foot      FAMILY HISTORY Family History  Problem Relation Age of Onset  . Anesthesia problems Other        family history   . Diabetes Other        family history   . Glaucoma Sister   .  Glaucoma Brother     SOCIAL HISTORY Social History   Tobacco Use  . Smoking status: Never Smoker  . Smokeless tobacco: Never Used  Substance Use Topics  . Alcohol use: No  . Drug use: No         OPHTHALMIC EXAM:  Base Eye Exam    Visual Acuity (Snellen - Linear)      Right Left   Dist cc 20/40 +1 20/20   Dist ph cc NI    Correction: Glasses       Tonometry (Tonopen, 2:45 PM)      Right Left   Pressure 25 15       Tonometry #2 (Tonopen, 4:22 PM)      Right Left   Pressure 21        Pupils      Dark Light Shape React APD   Right 4 3 Round Minimal 0   Left 3 2 Round Minimal 0       Visual Fields      Left Right    Full    Restrictions  Partial outer superior temporal, inferior temporal deficiencies       Extraocular Movement      Right Left    Full Full       Neuro/Psych    Oriented x3: Yes   Mood/Affect: Normal       Dilation    Right eye: 1.0% Mydriacyl, 2.5% Phenylephrine @ 2:45 PM        Slit Lamp and Fundus Exam    Slit Lamp Exam      Right Left   Lids/Lashes Dermatochalasis - upper lid, Meibomian gland dysfunction Dermatochalasis - upper lid, mild Meibomian gland dysfunction   Conjunctiva/Sclera mild Melanosis; mild pneumatic chemosis; 1+ injection; mild [place] ST quad mild Melanosis   Cornea mild Arcus mild Arcus, 1+ Punctate epithelial erosions   Anterior Chamber Deep and quiet Deep and quiet   Iris Round and dilated, No NVI Round and dilated, No NVI   Lens 1+ Nuclear sclerosis,1- 2+ Cortical cataract 1+ Nuclear sclerosis, 1+ Cortical cataract   Vitreous Vitreous syneresis, +pigment, PVD, pigmented vitreous condensations settling inferiorly; 10% gas bubble Vitreous syneresis, Posterior vitreous detachment       Fundus Exam      Right Left   Disc pink and sharp; +cupping; mild pallor Pink and Sharp   C/D Ratio 0.7 0.5   Macula Flat w/ macula reattached, good foveal reflex, SRF improved; +demarcation line inf macula; +RPE mottling Flat,  Good foveal reflex, mild Retinal pigment epithelial mottling, No heme or edema   Vessels mild Vascular attenuation, mild Copper wiring, AV crossing changes Vascular attenuation, mild Copper wiring, AV crossing changes   Periphery Retina reattached with vast improvement in SRF under superior gas bubble (10% remaining); good cryo  changes around retinal breaks; good early laser change SN quadrant, pre-op: bullous superior temporal RD starting at 0900 to 0100. Horse shoe retinal tear with bridging vessel superiorly (1100); lattice with retinal holes at 1030,  Attached, pigmented lattice at 0093-8182 - good laser changes surrounding patches of lattice          Refraction    Wearing Rx      Sphere Cylinder Axis   Right -6.25 +1.50 093   Left -5.25 +1.25 090   Type: SVL          IMAGING AND PROCEDURES  Imaging and Procedures for _0 @  OCT, Retina - OU - Both Eyes       Right Eye Quality was borderline. Central Foveal Thickness: 264. Progression has improved. Findings include subretinal fluid, normal foveal contour, no IRF, outer retinal atrophy (Interval improvement in shallow SRF; tr SRF at demarcation line -- stable).   Left Eye Quality was good. Central Foveal Thickness: 279. Progression has been stable. Findings include normal foveal contour, no SRF, no IRF.   Notes *Images captured and stored on drive  Diagnosis / Impression:  OD: Interval improvement in shallow SRF; tr SRF at demarcation line -- mild improvement OS: NFP, no IRF/SRF  Clinical management:  See below  Abbreviations: NFP - Normal foveal profile. CME - cystoid macular edema. PED - pigment epithelial detachment. IRF - intraretinal fluid. SRF - subretinal fluid. EZ - ellipsoid zone. ERM - epiretinal membrane. ORA - outer retinal atrophy. ORT - outer retinal tubulation. SRHM - subretinal hyper-reflective material               ASSESSMENT/PLAN:    ICD-10-CM   1. Right retinal detachment  H33.21   2.  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 Retinal holes, bilateral  H33.323   5. Retinal edema  H35.81 OCT, Retina - OU - Both Eyes  6. Diabetes mellitus type 2 without retinopathy ([place])  E11.9   7. Essential hypertension  I10   8. Hypertensive retinopathy of both eyes  H35.033   9. Combined forms of age-related cataract of both eyes  H25.813     1,2. Rhegmatogenous retinal detachment, right eye  - s/p laser retinopexy OD for tear/focal RD and lattice (3.4.20)  - 7.23.2020: acute bullous superior mac off detachment, onset of foveal involvement Thursday, 07/02/19 by history  - detached from 0900 to 0100 oclock, fovea off, horseshoe tear at 1100 and patches of lattice w/ retinal holes at 1030  - s/p pneumatic cryopexy OD in office (07.23.20) -- good cryo changes surrounding breaks from 1030-1100  - supplemental fill-in laser (07.29.20) - good laser changes  - gas bubble at 10%  - tr residual SRF at demarcation stable to slightly improved  - IOP 21 OD -- possible steroid response  - keep head upright while awake, sleep on left side until next visit  - okay to stop Lotemax SM   - cont maxitrol ung as needed and before bed  - f/u 1 week  3,4.Lattice degeneration w/ atrophic holes, both eyes  - patches of lattice located at 0730, 0800 and 1000 OD, 0430, 0530-0600 OS  - s/p laser retinopexy OD (03.04.20) -- good laser changes in place  - s/p laser retinopexy OS (03.12.20) -- good laser changes in place  - monitor  5. No retinal edema on exam or OCT OS  6. Diabetes mellitus, type 2 without retinopathy  -  The incidence, risk factors for progression, natural history and treatment options for diabetic retinopathy  were discussed with patient.    - The need for close monitoring of blood glucose, blood pressure, and serum lipids, avoiding cigarette or any type of tobacco, and the need for long term follow up was also discussed with  patient.  7,8. Hypertensive retinopathy OU  - discussed importance of tight BP control  - monitor  9. 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  - discussed diagnosis and progression  - not yet visually significant  - monitor for now   Ophthalmic Meds Ordered this visit:  No orders of the defined types were placed in this encounter.      Return in about 1 week (around 07/27/2019) for f/u RD OD, DFE, OCT.  There are no Patient Instructions on file for this visit.   Explained the diagnoses, plan, and follow up with the patient and they expressed understanding.  Patient expressed understanding of the importance of proper follow up care.   This document serves as a record of services personally performed by Gardiner Sleeper, MD, PhD. It was created on their behalf by Ernest Mallick, OA, an ophthalmic assistant. The creation of this record is the provider's dictation and/or activities during the visit.    Electronically signed by: Ernest Mallick, OA  08.10.2020 11:52 PM    Gardiner Sleeper, M.D., Ph.D. Diseases & Surgery of the Retina and Vitreous [facility]  I have reviewed the above documentation for accuracy and completeness, and I agree with the above. Gardiner Sleeper, M.D., Ph.D. 07/20/19 11:57 PM    Abbreviations: M myopia (nearsighted); A astigmatism; H hyperopia (farsighted); P presbyopia; Mrx spectacle prescription;  CTL contact lenses; OD right eye; OS left eye; OU both eyes  XT exotropia; ET esotropia; PEK punctate epithelial keratitis; PEE punctate epithelial erosions; DES dry eye syndrome; MGD meibomian gland dysfunction; ATs artificial tears; PFAT's preservative free artificial tears; [place] nuclear sclerotic cataract; PSC posterior subcapsular cataract; ERM epi-retinal membrane; PVD posterior vitreous detachment; RD retinal detachment; DM diabetes  mellitus; DR diabetic retinopathy; NPDR non-proliferative diabetic retinopathy; PDR proliferative diabetic retinopathy; CSME clinically significant macular edema; DME diabetic macular edema; dbh dot blot hemorrhages; CWS cotton wool spot; POAG primary open angle glaucoma; C/D cup-to-disc ratio; HVF humphrey visual field; GVF goldmann visual field; OCT optical coherence tomography; IOP intraocular pressure; BRVO Branch retinal vein occlusion; CRVO central retinal vein occlusion; CRAO central retinal artery occlusion; BRAO branch retinal artery occlusion; RT retinal tear; SB scleral buckle; PPV pars plana vitrectomy; VH Vitreous hemorrhage; PRP panretinal laser photocoagulation; IVK intravitreal kenalog; VMT vitreomacular traction; MH Macular hole;  NVD neovascularization of the disc; NVE neovascularization elsewhere; AREDS age related eye disease study; ARMD age related macular degeneration; POAG primary open angle glaucoma; EBMD epithelial/anterior basement membrane dystrophy; ACIOL anterior chamber intraocular lens; IOL intraocular lens; PCIOL posterior chamber intraocular lens; Phaco/IOL phacoemulsification with intraocular lens placement; [place] photorefractive keratectomy; LASIK laser assisted in situ keratomileusis; HTN hypertension; DM diabetes mellitus; COPD chronic obstructive pulmonary disease

## 2019-07-26 ENCOUNTER — Encounter (INDEPENDENT_AMBULATORY_CARE_PROVIDER_SITE_OTHER): Payer: Self-pay | Admitting: Ophthalmology

## 2019-07-27 ENCOUNTER — Ambulatory Visit (INDEPENDENT_AMBULATORY_CARE_PROVIDER_SITE_OTHER): Payer: BC Managed Care – PPO | Admitting: Ophthalmology

## 2019-07-27 ENCOUNTER — Encounter (INDEPENDENT_AMBULATORY_CARE_PROVIDER_SITE_OTHER): Payer: Self-pay | Admitting: Ophthalmology

## 2019-07-27 ENCOUNTER — Other Ambulatory Visit: Payer: Self-pay

## 2019-07-27 DIAGNOSIS — H35413 Lattice degeneration of retina, bilateral: Secondary | ICD-10-CM

## 2019-07-27 DIAGNOSIS — H3581 Retinal edema: Secondary | ICD-10-CM | POA: Diagnosis not present

## 2019-07-27 DIAGNOSIS — I10 Essential (primary) hypertension: Secondary | ICD-10-CM

## 2019-07-27 DIAGNOSIS — H33323 Round hole, bilateral: Secondary | ICD-10-CM

## 2019-07-27 DIAGNOSIS — H25813 Combined forms of age-related cataract, bilateral: Secondary | ICD-10-CM

## 2019-07-27 DIAGNOSIS — H35033 Hypertensive retinopathy, bilateral: Secondary | ICD-10-CM

## 2019-07-27 DIAGNOSIS — H33311 Horseshoe tear of retina without detachment, right eye: Secondary | ICD-10-CM

## 2019-07-27 DIAGNOSIS — E119 Type 2 diabetes mellitus without complications: Secondary | ICD-10-CM

## 2019-07-27 DIAGNOSIS — H3321 Serous retinal detachment, right eye: Secondary | ICD-10-CM

## 2019-07-27 NOTE — Progress Notes (Signed)
[facility] Note  07/27/2019     CHIEF COMPLAINT Patient presents for Post-op Follow-up   HISTORY OF PRESENT ILLNESS: Nancy [name] is a 58 y.o. female who presents to the clinic today for:   HPI    Post-op Follow-up    In right eye.  Discomfort includes pain and tearing.  Vision is stable.  I, the attending physician,  performed the HPI with the patient and updated documentation appropriately.          Comments    3 week s/p pneumatic od (07/23/200. Pateint staes she had a burning sensation and some pain towards back of her head. Patient is using the ointment. The bubble looks like the size of a dime. vision still has some waviness.        Last edited by Bernarda Caffey, MD on 07/27/2019 11:55 PM. (History)    pt states the gas bubble is about the size of a dime, she states it had "mickey mouse ears", but they are gone now, she states she put the ointment in yesterday bc her eye was burning and felt dry, she states this started Saturday, she states she had a headache yesterday that she had to take two 565m tylenol for, she states the ointment did seem to help with the dryness   Referring physician: [place] [place]  [place] [id number] HISTORICAL INFORMATION:   [facility] [place] [facility] the [place]erred [place] Dr. MMadelin Headingsfor concern of retinal holes OD LEE: 02.26.20 (M. Cotter) [BCVA: 20/20 OU] Ocular Hx- PMH-DM, HLD, sickle cell trait    CURRENT MEDICATIONS: Current Outpatient Medications (Ophthalmic Drugs)  Medication Sig  . neomycin-polymyxin-dexameth (MAXITROL) 0.1 % OINT Place 1 application into the right eye 4 (four) times daily.   No current facility-administered medications for this visit.  (Ophthalmic Drugs)   Current Outpatient Medications (Other)  Medication Sig  . albuterol (PROVENTIL HFA;VENTOLIN HFA) 108 (90 BASE) MCG/ACT inhaler Inhale 2 puffs into the lungs every 6 (six) hours as  needed.  . Blood Glucose Monitoring Suppl (BLOOD GLUCOSE METER) kit Use as instructed, check sugars one time a day at least every other day.  .Marland KitchenFARXIGA 10 MG TABS tablet   . fluticasone (FLONASE) 50 MCG/ACT nasal spray fluticasone propionate 50 mcg/actuation nasal spray,suspension  . glipiZIDE (GLUCOTROL) 5 MG tablet glipizide 5 mg tablet  . Lancets (ONETOUCH DELICA PLUS [id number] MISC USE 1 TO CHECK GLUCOSE TWICE DAILY  . lisinopril (PRINIVIL,ZESTRIL) 2.5 MG tablet lisinopril 2.5 mg tablet  . lovastatin (MEVACOR) 10 MG tablet lovastatin 10 mg tablet  . methocarbamol (ROBAXIN) 500 MG tablet Take 1 or 2 po Q 6hrs for muscle pain  . naproxen (NAPROSYN) 250 MG tablet Take 1 po BID with food prn pain  . ONE TOUCH ULTRA TEST test strip USE 1 STRIP TO CHECK GLUCOSE ONCE TO TWICE DAILY   No current facility-administered medications for this visit.  (Other)      REVIEW OF SYSTEMS: ROS    Positive for: Musculoskeletal, Eyes, Respiratory   Negative for: Constitutional, Gastrointestinal, Neurological, Skin, Genitourinary, HENT, Endocrine, Cardiovascular, Psychiatric, Allergic/Imm, Heme/Lymph   Last edited by AElmore Guiseon 07/27/2019  1:34 PM. (History)       ALLERGIES Allergies  Allergen Reactions  . Cantaloupe (Diagnostic) Anaphylaxis  . Strawberry Extract Anaphylaxis  . Watermelon Flavor Anaphylaxis  . Metformin And Related Other (See Comments)    Lethargic, swelling of the eyes, drowsiness  .  Penicillins Hives    PAST MEDICAL HISTORY Past Medical History:  Diagnosis Date  . Asthma   . Cataract    OU  . Diabetes mellitus without complication ([place])   . Hypertension   . Hypertensive retinopathy    OU  . Retinal detachment 07/02/2019   OD   Past Surgical History:  Procedure Laterality Date  . BREAST SURGERY    . REPLACEMENT TOTAL KNEE    . RETINAL DETACHMENT SURGERY Right 07/02/2019   Pneumatic retinopexy - Dr. Bernarda Caffey  . right foot      FAMILY HISTORY Family  History  Problem Relation Age of Onset  . Anesthesia problems Other        family history   . Diabetes Other        family history   . Glaucoma Sister   . Glaucoma Brother     SOCIAL HISTORY Social History   Tobacco Use  . Smoking status: Never Smoker  . Smokeless tobacco: Never Used  Substance Use Topics  . Alcohol use: No  . Drug use: No         OPHTHALMIC EXAM:  Base Eye Exam    Visual Acuity (Snellen - Linear)      Right Left   Dist cc 20/40-2 20/20-1   Dist ph cc 20/NI    Correction: Glasses       Tonometry (Tonopen, 1:35 PM)      Right Left   Pressure 27,2 17       Pupils      Dark Light Shape React APD   Right 3 2 Round Minimal None   Left 3 2 Round Minimal None       Visual Fields      Left Right    Full Full       Extraocular Movement      Right Left    Full, Ortho Full, Ortho       Neuro/Psych    Oriented x3: Yes   Mood/Affect: Normal       Dilation    Both eyes: 1.0% Mydriacyl, 2.5% Phenylephrine @ 1:34 PM        Slit Lamp and Fundus Exam    Slit Lamp Exam      Right Left   Lids/Lashes Dermatochalasis - upper lid, Meibomian gland dysfunction Dermatochalasis - upper lid, mild Meibomian gland dysfunction   Conjunctiva/Sclera mild Melanosis; mild pneumatic chemosis; 1+ injection; mild [place] ST quad mild Melanosis   Cornea mild Arcus, trace Punctate epithelial erosions mild Arcus, 1+ Punctate epithelial erosions   Anterior Chamber Deep and quiet Deep and quiet   Iris Round and dilated, No NVI Round and dilated, No NVI   Lens 1+ Nuclear sclerosis,1- 2+ Cortical cataract 1+ Nuclear sclerosis, 1+ Cortical cataract   Vitreous Vitreous syneresis, +pigment, PVD, pigmented vitreous condensations settling inferiorly; ~5% gas bubble Vitreous syneresis, Posterior vitreous detachment       Fundus Exam      Right Left   Disc pink and sharp; +cupping; mild pallor Pink and Sharp   C/D Ratio 0.7 0.5   Macula Flat w/ macula reattached, blunted  foveal reflex, SRF improved; +demarcation line inf macula; +RPE mottling Flat, Good foveal reflex, mild Retinal pigment epithelial mottling, No heme or edema   Vessels mild Vascular attenuation, mild Copper wiring, AV crossing changes Vascular attenuation, mild Copper wiring, AV crossing changes   Periphery Retina reattached with vast improvement in SRF under superior gas bubble (5% remaining); good cryo  changes around retinal breaks; good early laser change SN quadrant, pre-op: bullous superior temporal RD starting at 0900 to 0100. Horse shoe retinal tear with bridging vessel superiorly (1100); lattice with retinal holes at 1030,  Attached, pigmented lattice at 8185-6314 - good laser changes surrounding patches of lattice          Refraction    Wearing Rx      Sphere Cylinder Axis   Right -6.25 +1.50 093   Left -5.25 +1.25 090   Type: SVL          IMAGING AND PROCEDURES  Imaging and Procedures for _0 @  OCT, Retina - OU - Both Eyes       Right Eye Quality was borderline. Central Foveal Thickness: 258. Progression has been stable. Findings include subretinal fluid, normal foveal contour, no IRF, outer retinal atrophy (retina stably reattached; tr SRF at demarcation line -- persistent/slightly improved).   Left Eye Quality was good. Central Foveal Thickness: 274. Progression has been stable. Findings include normal foveal contour, no SRF, no IRF.   Notes *Images captured and stored on drive  Diagnosis / Impression:  OD: retina stably reattached; tr SRF at demarcation line -- persistent/slightly improved OS: NFP, no IRF/SRF  Clinical management:  See below  Abbreviations: NFP - Normal foveal profile. CME - cystoid macular edema. PED - pigment epithelial detachment. IRF - intraretinal fluid. SRF - subretinal fluid. EZ - ellipsoid zone. ERM - epiretinal membrane. ORA - outer retinal atrophy. ORT - outer retinal tubulation. SRHM - subretinal hyper-reflective material                ASSESSMENT/PLAN:    ICD-10-CM   1. Right retinal detachment  H33.21   2. 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 Retinal holes, bilateral  H33.323   5. Retinal edema  H35.81 OCT, Retina - OU - Both Eyes  6. Diabetes mellitus type 2 without retinopathy ([place])  E11.9   7. Essential hypertension  I10   8. Hypertensive retinopathy of both eyes  H35.033   9. Combined forms of age-related cataract of both eyes  H25.813     1,2. Rhegmatogenous retinal detachment, right eye  - s/p laser retinopexy OD for tear/focal RD and lattice (3.4.20)  - 7.23.2020: acute bullous superior mac off detachment, onset of foveal involvement Thursday, 07/02/19 by history  - detached from 0900 to 0100 oclock, fovea off, horseshoe tear at 1100 and patches of lattice w/ retinal holes at 1030  - s/p pneumatic cryopexy OD in office (07.23.20) -- good cryo changes surrounding breaks from 1030-1100  - supplemental fill-in laser (07.29.20) - good laser changes  - gas bubble at ~5%  - tr residual SRF at demarcation -- stable to slightly improved  - IOP 27 OD -- possible steroid response  - keep head upright while awake, sleep on left side until next visit  - cont maxitrol ung as needed and before bed  - start Cosopt BID OD  - f/u 1 week  3,4.Lattice degeneration w/ atrophic holes, both eyes  - patches of lattice located at 0730, 0800 and 1000 OD, 0430, 0530-0600 OS  - s/p laser retinopexy OD (03.04.20) -- good laser changes in place  - s/p laser retinopexy OS (03.12.20) -- good laser changes in place  - monitor  5. No retinal edema on exam or OCT OS  6. Diabetes mellitus, type 2 without retinopathy  - The incidence,  risk factors for progression, natural history and treatment options for diabetic retinopathy  were discussed with patient.    - The need for close monitoring of blood glucose, blood pressure, and serum lipids, avoiding cigarette or any type  of tobacco, and the need for long term follow up was also discussed with patient.  7,8. Hypertensive retinopathy OU  - discussed importance of tight BP control  - monitor  9. 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  - discussed diagnosis and progression  - not yet visually significant  - monitor for now   Ophthalmic Meds Ordered this visit:  No orders of the defined types were placed in this encounter.      Return in about 1 week (around 08/03/2019) for f/u RD OD, DFE, OCT.  There are no Patient Instructions on file for this visit.   Explained the diagnoses, plan, and follow up with the patient and they expressed understanding.  Patient expressed understanding of the importance of proper follow up care.   This document serves as a record of services personally performed by Gardiner Sleeper, MD, PhD. It was created on their behalf by Ernest Mallick, OA, an ophthalmic assistant. The creation of this record is the provider's dictation and/or activities during the visit.    Electronically signed by: Ernest Mallick, OA 08.17.2020 11:57 PM    Gardiner Sleeper, M.D., Ph.D. Diseases & Surgery of the Retina and Vitreous [facility]  I have reviewed the above documentation for accuracy and completeness, and I agree with the above. Gardiner Sleeper, M.D., Ph.D. 07/27/19 11:58 PM    Abbreviations: M myopia (nearsighted); A astigmatism; H hyperopia (farsighted); P presbyopia; Mrx spectacle prescription;  CTL contact lenses; OD right eye; OS left eye; OU both eyes  XT exotropia; ET esotropia; PEK punctate epithelial keratitis; PEE punctate epithelial erosions; DES dry eye syndrome; MGD meibomian gland dysfunction; ATs artificial tears; PFAT's preservative free artificial tears; [place] nuclear sclerotic cataract; PSC posterior subcapsular cataract; ERM epi-retinal membrane; PVD  posterior vitreous detachment; RD retinal detachment; DM diabetes mellitus; DR diabetic retinopathy; NPDR non-proliferative diabetic retinopathy; PDR proliferative diabetic retinopathy; CSME clinically significant macular edema; DME diabetic macular edema; dbh dot blot hemorrhages; CWS cotton wool spot; POAG primary open angle glaucoma; C/D cup-to-disc ratio; HVF humphrey visual field; GVF goldmann visual field; OCT optical coherence tomography; IOP intraocular pressure; BRVO Branch retinal vein occlusion; CRVO central retinal vein occlusion; CRAO central retinal artery occlusion; BRAO branch retinal artery occlusion; RT retinal tear; SB scleral buckle; PPV pars plana vitrectomy; VH Vitreous hemorrhage; PRP panretinal laser photocoagulation; IVK intravitreal kenalog; VMT vitreomacular traction; MH Macular hole;  NVD neovascularization of the disc; NVE neovascularization elsewhere; AREDS age related eye disease study; ARMD age related macular degeneration; POAG primary open angle glaucoma; EBMD epithelial/anterior basement membrane dystrophy; ACIOL anterior chamber intraocular lens; IOL intraocular lens; PCIOL posterior chamber intraocular lens; Phaco/IOL phacoemulsification with intraocular lens placement; [place] photorefractive keratectomy; LASIK laser assisted in situ keratomileusis; HTN hypertension; DM diabetes mellitus; COPD chronic obstructive pulmonary disease

## 2019-08-05 ENCOUNTER — Other Ambulatory Visit: Payer: Self-pay

## 2019-08-05 ENCOUNTER — Encounter (INDEPENDENT_AMBULATORY_CARE_PROVIDER_SITE_OTHER): Payer: Self-pay | Admitting: Ophthalmology

## 2019-08-05 ENCOUNTER — Ambulatory Visit (INDEPENDENT_AMBULATORY_CARE_PROVIDER_SITE_OTHER): Payer: BC Managed Care – PPO | Admitting: Ophthalmology

## 2019-08-05 DIAGNOSIS — H3581 Retinal edema: Secondary | ICD-10-CM

## 2019-08-05 DIAGNOSIS — H25813 Combined forms of age-related cataract, bilateral: Secondary | ICD-10-CM

## 2019-08-05 DIAGNOSIS — H35413 Lattice degeneration of retina, bilateral: Secondary | ICD-10-CM

## 2019-08-05 DIAGNOSIS — H35033 Hypertensive retinopathy, bilateral: Secondary | ICD-10-CM

## 2019-08-05 DIAGNOSIS — H3321 Serous retinal detachment, right eye: Secondary | ICD-10-CM

## 2019-08-05 DIAGNOSIS — H33311 Horseshoe tear of retina without detachment, right eye: Secondary | ICD-10-CM

## 2019-08-05 DIAGNOSIS — I10 Essential (primary) hypertension: Secondary | ICD-10-CM

## 2019-08-05 DIAGNOSIS — H33323 Round hole, bilateral: Secondary | ICD-10-CM

## 2019-08-05 DIAGNOSIS — E119 Type 2 diabetes mellitus without complications: Secondary | ICD-10-CM

## 2019-08-05 NOTE — Progress Notes (Signed)
[facility] Note  08/05/2019     CHIEF COMPLAINT Patient presents for Post-op Follow-up   HISTORY OF PRESENT ILLNESS: Nancy [name] is a 58 y.o. female who presents to the clinic today for:   HPI    Post-op Follow-up    In right eye.  Discomfort includes floaters.  Negative for pain, itching, foreign body sensation, tearing and discharge.  Vision is improved.  I, the attending physician,  performed the HPI with the patient and updated documentation appropriately.          Comments    F/U S/P pneumatic OD 07/02/2019. Patient states the bubble has moved to lower part of OD and she has occasional floaters od, denies new visual onsets/issues. Bs 140 this am, A1C 7.8 (07/29/19), Bp WINL per patient        Last edited by Bernarda Caffey, MD on 08/05/2019  2:39 PM. (History)    pt states her gas bubble is very small now, she states it's come down a lot from Sunday to today, she states her vision is still a little wavy looking   Referring physician: Madelin Headings, DO [address] [place],  [place] [id number]  HISTORICAL INFORMATION:   Selected notes from the [facility] Referred by Dr. Madelin Headings for concern of retinal holes OD LEE: 02.26.20 (M. Cotter) [BCVA: 20/20 OU] Ocular Hx- PMH-DM, HLD, sickle cell trait    CURRENT MEDICATIONS: Current Outpatient Medications (Ophthalmic Drugs)  Medication Sig  . neomycin-polymyxin-dexameth (MAXITROL) 0.1 % OINT Place 1 application into the right eye 4 (four) times daily.   No current facility-administered medications for this visit.  (Ophthalmic Drugs)   Current Outpatient Medications (Other)  Medication Sig  . albuterol (PROVENTIL HFA;VENTOLIN HFA) 108 (90 BASE) MCG/ACT inhaler Inhale 2 puffs into the lungs every 6 (six) hours as needed.  . Blood Glucose Monitoring Suppl (BLOOD GLUCOSE METER) kit Use as instructed, check sugars one time a day at least every other day.  Marland Kitchen FARXIGA 10 MG TABS tablet    . fluticasone (FLONASE) 50 MCG/ACT nasal spray fluticasone propionate 50 mcg/actuation nasal spray,suspension  . glipiZIDE (GLUCOTROL) 5 MG tablet glipizide 5 mg tablet  . Lancets (ONETOUCH DELICA PLUS [id number]) MISC USE 1 TO CHECK GLUCOSE TWICE DAILY  . lisinopril (PRINIVIL,ZESTRIL) 2.5 MG tablet lisinopril 2.5 mg tablet  . lovastatin (MEVACOR) 10 MG tablet lovastatin 10 mg tablet  . methocarbamol (ROBAXIN) 500 MG tablet Take 1 or 2 po Q 6hrs for muscle pain  . naproxen (NAPROSYN) 250 MG tablet Take 1 po BID with food prn pain  . ONE TOUCH ULTRA TEST test strip USE 1 STRIP TO CHECK GLUCOSE ONCE TO TWICE DAILY   No current facility-administered medications for this visit.  (Other)      REVIEW OF SYSTEMS: ROS    Positive for: Endocrine, Eyes   Negative for: Constitutional, Gastrointestinal, Neurological, Skin, Genitourinary, Musculoskeletal, HENT, Cardiovascular, Respiratory, Psychiatric, Allergic/Imm, Heme/Lymph   Last edited by Zenovia Jordan, LPN on 7/42/5956  3:87 PM. (History)       ALLERGIES Allergies  Allergen Reactions  . Cantaloupe (Diagnostic) Anaphylaxis  . Strawberry Extract Anaphylaxis  . Watermelon Flavor Anaphylaxis  . Metformin And Related Other (See Comments)    Lethargic, swelling of the eyes, drowsiness  . Penicillins Hives    PAST MEDICAL HISTORY Past Medical History:  Diagnosis Date  . Asthma   . Cataract    OU  . Diabetes mellitus without complication ([place])   .  Hypertension   . Hypertensive retinopathy    OU  . Retinal detachment 07/02/2019   OD   Past Surgical History:  Procedure Laterality Date  . BREAST SURGERY    . REPLACEMENT TOTAL KNEE    . RETINAL DETACHMENT SURGERY Right 07/02/2019   Pneumatic retinopexy - Dr. Bernarda Caffey  . right foot      FAMILY HISTORY Family History  Problem Relation Age of Onset  . Anesthesia problems Other        family history   . Diabetes Other        family history   . Glaucoma Sister   .  Glaucoma Brother     SOCIAL HISTORY Social History   Tobacco Use  . Smoking status: Never Smoker  . Smokeless tobacco: Never Used  Substance Use Topics  . Alcohol use: No  . Drug use: No         OPHTHALMIC EXAM:  Base Eye Exam    Visual Acuity (Snellen - Linear)      Right Left   Dist cc 20/30 -2 20/20   Dist ph cc NI    Correction: Glasses       Tonometry (Tonopen, 1:22 PM)      Right Left   Pressure 21 16       Pupils      Dark Light Shape React APD   Right 3 2 Round Brisk None   Left 3 2 Round Brisk None       Visual Fields (Counting fingers)      Left Right    Full Full       Extraocular Movement      Right Left    Full, Ortho Full, Ortho       Neuro/Psych    Oriented x3: Yes   Mood/Affect: Normal       Dilation    Both eyes: 1.0% Mydriacyl, 2.5% Phenylephrine @ 1:22 PM        Slit Lamp and Fundus Exam    Slit Lamp Exam      Right Left   Lids/Lashes Dermatochalasis - upper lid, Meibomian gland dysfunction Dermatochalasis - upper lid, mild Meibomian gland dysfunction   Conjunctiva/Sclera mild Melanosis; mild pneumatic chemosis; 1+ injection; mild [place] ST quad mild Melanosis   Cornea mild Arcus, trace Punctate epithelial erosions mild Arcus, 1+ Punctate epithelial erosions   Anterior Chamber Deep and quiet Deep and quiet   Iris Round and dilated, No NVI Round and dilated, No NVI   Lens 1+ Nuclear sclerosis,1- 2+ Cortical cataract 1+ Nuclear sclerosis, 1+ Cortical cataract   Vitreous Vitreous syneresis, +pigment, PVD, pigmented vitreous condensations settling inferiorly; ~5% gas bubble Vitreous syneresis, Posterior vitreous detachment       Fundus Exam      Right Left   Disc pink and sharp; +cupping; mild pallor Pink and Sharp   C/D Ratio 0.7 0.5   Macula Flat w/ macula reattached, good foveal reflex, SRF improved; +demarcation line inf macula; +RPE mottling Flat, Good foveal reflex, mild Retinal pigment epithelial mottling, No heme or edema    Vessels mild Vascular attenuation, mild Copper wiring, AV crossing changes Vascular attenuation, mild Copper wiring, AV crossing changes   Periphery Retina reattached with vast improvement in SRF under superior gas bubble (5% remaining); good cryo changes around retinal breaks; good early laser change SN quadrant, pre-op: bullous superior temporal RD starting at 0900 to 0100. Horse shoe retinal tear with bridging vessel superiorly (1100); lattice with retinal  holes at 1030,  Attached, pigmented lattice at [id number] - good laser changes surrounding patches of lattice            IMAGING AND PROCEDURES  Imaging and Procedures for _0 @  OCT, Retina - OU - Both Eyes       Right Eye Quality was borderline. Central Foveal Thickness: 260. Progression has been stable. Findings include subretinal fluid, normal foveal contour, no IRF, outer retinal atrophy (retina stably reattached; tr SRF at demarcation line -- persistent/slightly improved).   Left Eye Quality was good. Central Foveal Thickness: 272. Progression has been stable. Findings include normal foveal contour, no SRF, no IRF.   Notes *Images captured and stored on drive  Diagnosis / Impression:  OD: retina stably reattached; tr SRF at demarcation line -- persistent/slightly improved OS: NFP, no IRF/SRF  Clinical management:  See below  Abbreviations: NFP - Normal foveal profile. CME - cystoid macular edema. PED - pigment epithelial detachment. IRF - intraretinal fluid. SRF - subretinal fluid. EZ - ellipsoid zone. ERM - epiretinal membrane. ORA - outer retinal atrophy. ORT - outer retinal tubulation. SRHM - subretinal hyper-reflective material               ASSESSMENT/PLAN:    ICD-10-CM   1. Right retinal detachment  H33.21   2. 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 Retinal holes, bilateral  H33.323   5. Retinal edema  H35.81 OCT, Retina - OU - Both Eyes  6.  Diabetes mellitus type 2 without retinopathy ([place])  E11.9   7. Essential hypertension  I10   8. Hypertensive retinopathy of both eyes  H35.033   9. Combined forms of age-related cataract of both eyes  H25.813     1,2. Rhegmatogenous retinal detachment, right eye  - s/p laser retinopexy OD for tear/focal RD and lattice (3.4.20)  - 7.23.2020: acute bullous superior mac off detachment, onset of foveal involvement Thursday, 07/02/19 by history  - detached from 0900 to 0100 oclock, fovea off, horseshoe tear at 1100 and patches of lattice w/ retinal holes at 1030  - s/p pneumatic cryopexy OD in office (07.23.20) -- good cryo changes surrounding breaks from 1030-1100  - supplemental fill-in laser (07.29.20) - good laser changes  - gas bubble at ~1-2%  - tr residual SRF at demarcation -- stable to slightly improved  - BCVA improved to 20/30-2  - IOP 21 OD -- possible residual steroid response -- currently not using maxitrol ung  - keep head upright while awake, sleep on left side  - cont Cosopt BID OD  - f/u 2 weeks  3,4.Lattice degeneration w/ atrophic holes, both eyes  - patches of lattice located at 0730, 0800 and 1000 OD, 0430, 0530-0600 OS  - s/p laser retinopexy OD (03.04.20) -- good laser changes in place  - s/p laser retinopexy OS (03.12.20) -- good laser changes in place  - monitor  5. No retinal edema on exam or OCT OS  6. Diabetes mellitus, type 2 without retinopathy  - The incidence, risk factors for progression, natural history and treatment options for diabetic retinopathy  were discussed with patient.    - The need for close monitoring of blood glucose, blood pressure, and serum lipids, avoiding cigarette or any type of tobacco, and the need for long term follow up was also discussed with patient.  7,8. Hypertensive retinopathy OU  - discussed importance of tight BP control  -  monitor  9. Mild Mixed for age related cataracts OU  - The symptoms of cataract, surgical  options, and treatments and risks were discussed with patient.  - discussed diagnosis and progression  - not yet visually significant  - monitor for now  Ophthalmic Meds Ordered this visit:  No orders of the defined types were placed in this encounter.      Return in about 2 weeks (around 08/19/2019) for f/u RD OD, DFE, OCT.  There are no Patient Instructions on file for this visit.   Explained the diagnoses, plan, and follow up with the patient and they expressed understanding.  Patient expressed understanding of the importance of proper follow up care.   This document serves as a record of services personally performed by Gardiner Sleeper, MD, PhD. It was created on their behalf by Ernest Mallick, OA, an ophthalmic assistant. The creation of this record is the provider's dictation and/or activities during the visit.    Electronically signed by: Ernest Mallick, OA  08.26.2020 11:59 PM    Gardiner Sleeper, M.D., Ph.D. Diseases & Surgery of the Retina and Vitreous [facility]  I have reviewed the above documentation for accuracy and completeness, and I agree with the above. Gardiner Sleeper, M.D., Ph.D. 08/06/19 12:06 AM   Abbreviations: M myopia (nearsighted); A astigmatism; H hyperopia (farsighted); P presbyopia; Mrx spectacle prescription;  CTL contact lenses; OD right eye; OS left eye; OU both eyes  XT exotropia; ET esotropia; PEK punctate epithelial keratitis; PEE punctate epithelial erosions; DES dry eye syndrome; MGD meibomian gland dysfunction; ATs artificial tears; PFAT's preservative free artificial tears; [place] nuclear sclerotic cataract; PSC posterior subcapsular cataract; ERM epi-retinal membrane; PVD posterior vitreous detachment; RD retinal detachment; DM diabetes mellitus; DR diabetic retinopathy; NPDR non-proliferative diabetic retinopathy; PDR proliferative diabetic retinopathy; CSME clinically significant macular edema; DME diabetic macular edema; dbh dot  blot hemorrhages; CWS cotton wool spot; POAG primary open angle glaucoma; C/D cup-to-disc ratio; HVF humphrey visual field; GVF goldmann visual field; OCT optical coherence tomography; IOP intraocular pressure; BRVO Branch retinal vein occlusion; CRVO central retinal vein occlusion; CRAO central retinal artery occlusion; BRAO branch retinal artery occlusion; RT retinal tear; SB scleral buckle; PPV pars plana vitrectomy; VH Vitreous hemorrhage; PRP panretinal laser photocoagulation; IVK intravitreal kenalog; VMT vitreomacular traction; MH Macular hole;  NVD neovascularization of the disc; NVE neovascularization elsewhere; AREDS age related eye disease study; ARMD age related macular degeneration; POAG primary open angle glaucoma; EBMD epithelial/anterior basement membrane dystrophy; ACIOL anterior chamber intraocular lens; IOL intraocular lens; PCIOL posterior chamber intraocular lens; Phaco/IOL phacoemulsification with intraocular lens placement; [place] photorefractive keratectomy; LASIK laser assisted in situ keratomileusis; HTN hypertension; DM diabetes mellitus; COPD chronic obstructive pulmonary disease

## 2019-08-07 ENCOUNTER — Encounter (INDEPENDENT_AMBULATORY_CARE_PROVIDER_SITE_OTHER): Payer: Self-pay | Admitting: Ophthalmology

## 2019-08-21 ENCOUNTER — Other Ambulatory Visit: Payer: Self-pay

## 2019-08-21 ENCOUNTER — Ambulatory Visit (INDEPENDENT_AMBULATORY_CARE_PROVIDER_SITE_OTHER): Payer: BC Managed Care – PPO | Admitting: Ophthalmology

## 2019-08-21 ENCOUNTER — Encounter (INDEPENDENT_AMBULATORY_CARE_PROVIDER_SITE_OTHER): Payer: Self-pay | Admitting: Ophthalmology

## 2019-08-21 DIAGNOSIS — H25813 Combined forms of age-related cataract, bilateral: Secondary | ICD-10-CM

## 2019-08-21 DIAGNOSIS — H35413 Lattice degeneration of retina, bilateral: Secondary | ICD-10-CM

## 2019-08-21 DIAGNOSIS — H3581 Retinal edema: Secondary | ICD-10-CM

## 2019-08-21 DIAGNOSIS — H33323 Round hole, bilateral: Secondary | ICD-10-CM

## 2019-08-21 DIAGNOSIS — H35033 Hypertensive retinopathy, bilateral: Secondary | ICD-10-CM

## 2019-08-21 DIAGNOSIS — I10 Essential (primary) hypertension: Secondary | ICD-10-CM

## 2019-08-21 DIAGNOSIS — E119 Type 2 diabetes mellitus without complications: Secondary | ICD-10-CM

## 2019-08-21 DIAGNOSIS — H33311 Horseshoe tear of retina without detachment, right eye: Secondary | ICD-10-CM

## 2019-08-21 DIAGNOSIS — H3321 Serous retinal detachment, right eye: Secondary | ICD-10-CM

## 2019-08-21 NOTE — Progress Notes (Signed)
[facility] Note  08/21/2019     CHIEF COMPLAINT Patient presents for Retina Follow Up   HISTORY OF PRESENT ILLNESS: Nancy [name] is a 58 y.o. female who presents to the clinic today for:   HPI    Retina Follow Up    Patient presents with  Retinal Break/Detachment.  In right eye.  This started 1.5 months ago.  Severity is moderate.  Duration of 2 weeks.  Since onset it is stable.          Comments    58 y/o female pt here for 2 wk f/u for RD repair..  S/p pneumatic cryopexy OD in office 07.23.20 and supplemental fill-in laser OD 07.29.20.  No change in [place] OU.  Gas bubble OD now gone.  Minor pain OD at times.  Denies flashes, but sees ocassional small floaters OU.  Cosopt BID OD.  BS 136 this a.m.  A1C 7.6.       Last edited by Matthew Folks, COA on 08/21/2019  2:01 PM. (History)    pt states her gas bubble is gone, she states her vision is stable from last time  Referring physician: Redmond School, MD [place],  [place] [address]  HISTORICAL INFORMATION:   Selected notes from the [facility] Referred by Dr. Madelin Headings for concern of retinal holes OD LEE: 02.26.20 (M. Cotter) [BCVA: 20/20 OU] Ocular Hx- PMH-DM, HLD, sickle cell trait    CURRENT MEDICATIONS: Current Outpatient Medications (Ophthalmic Drugs)  Medication Sig  . neomycin-polymyxin-dexameth (MAXITROL) 0.1 % OINT Place 1 application into the right eye 4 (four) times daily. (Patient not taking: Reported on 08/21/2019)   No current facility-administered medications for this visit.  (Ophthalmic Drugs)   Current Outpatient Medications (Other)  Medication Sig  . albuterol (PROVENTIL HFA;VENTOLIN HFA) 108 (90 BASE) MCG/ACT inhaler Inhale 2 puffs into the lungs every 6 (six) hours as needed.  . Blood Glucose Monitoring Suppl (BLOOD GLUCOSE METER) kit Use as instructed, check sugars one time a day at least every other day.  Marland Kitchen FARXIGA 10 MG TABS tablet   .  fluticasone (FLONASE) 50 MCG/ACT nasal spray fluticasone propionate 50 mcg/actuation nasal spray,suspension  . glipiZIDE (GLUCOTROL) 5 MG tablet glipizide 5 mg tablet  . Lancets (ONETOUCH DELICA PLUS [id number]) MISC USE 1 TO CHECK GLUCOSE TWICE DAILY  . lisinopril (PRINIVIL,ZESTRIL) 2.5 MG tablet lisinopril 2.5 mg tablet  . lovastatin (MEVACOR) 10 MG tablet lovastatin 10 mg tablet  . methocarbamol (ROBAXIN) 500 MG tablet Take 1 or 2 po Q 6hrs for muscle pain  . naproxen (NAPROSYN) 250 MG tablet Take 1 po BID with food prn pain  . ONE TOUCH ULTRA TEST test strip USE 1 STRIP TO CHECK GLUCOSE ONCE TO TWICE DAILY   No current facility-administered medications for this visit.  (Other)      REVIEW OF SYSTEMS: ROS    Positive for: Endocrine, Eyes   Negative for: Constitutional, Gastrointestinal, Neurological, Skin, Genitourinary, Musculoskeletal, HENT, Cardiovascular, Respiratory, Psychiatric, Allergic/Imm, Heme/Lymph   Last edited by Matthew Folks, COA on 08/21/2019  1:57 PM. (History)       ALLERGIES Allergies  Allergen Reactions  . Cantaloupe (Diagnostic) Anaphylaxis  . Strawberry Extract Anaphylaxis  . Watermelon Flavor Anaphylaxis  . Metformin And Related Other (See Comments)    Lethargic, swelling of the eyes, drowsiness  . Penicillins Hives    PAST MEDICAL HISTORY Past Medical History:  Diagnosis Date  . Asthma   .  Cataract    OU  . Diabetes mellitus without complication ([place])   . Hypertension   . Hypertensive retinopathy    OU  . Retinal detachment 07/02/2019   OD   Past Surgical History:  Procedure Laterality Date  . BREAST SURGERY    . REPLACEMENT TOTAL KNEE    . RETINAL DETACHMENT SURGERY Right 07/02/2019   Pneumatic retinopexy - Dr. Bernarda Caffey  . right foot      FAMILY HISTORY Family History  Problem Relation Age of Onset  . Anesthesia problems Other        family history   . Diabetes Other        family history   . Glaucoma Sister   . Glaucoma  Brother     SOCIAL HISTORY Social History   Tobacco Use  . Smoking status: Never Smoker  . Smokeless tobacco: Never Used  Substance Use Topics  . Alcohol use: No  . Drug use: No         OPHTHALMIC EXAM:  Base Eye Exam    Visual Acuity (Snellen - Linear)      Right Left   Dist cc 20/30 20/20   Dist ph cc NI    Correction: Glasses       Tonometry (Tonopen, 2:00 PM)      Right Left   Pressure 13 11       Pupils      Dark Light Shape React APD   Right 3 2 Round Brisk None   Left 3 2 Round Brisk None       Visual Fields (Counting fingers)      Left Right    Full Full       Extraocular Movement      Right Left    Full, Ortho Full, Ortho       Neuro/Psych    Oriented x3: Yes   Mood/Affect: Normal       Dilation    Both eyes: 1.0% Mydriacyl, 2.5% Phenylephrine @ 2:00 PM        Slit Lamp and Fundus Exam    Slit Lamp Exam      Right Left   Lids/Lashes Dermatochalasis - upper lid, Meibomian gland dysfunction Dermatochalasis - upper lid, mild Meibomian gland dysfunction   Conjunctiva/Sclera mild Melanosis; mild pneumatic chemosis; 1+ injection; mild [place] ST quad mild Melanosis   Cornea mild Arcus, trace Punctate epithelial erosions mild Arcus, 1+ Punctate epithelial erosions   Anterior Chamber Deep and quiet Deep and quiet   Iris Round and dilated, No NVI Round and dilated, No NVI   Lens 1+ Nuclear sclerosis,1- 2+ Cortical cataract 1+ Nuclear sclerosis, 1+ Cortical cataract   Vitreous Vitreous syneresis, +mild pigment, PVD, gas bubble gone, vitreous condensations Vitreous syneresis, Posterior vitreous detachment       Fundus Exam      Right Left   Disc Sharp rim; +cupping; mild pallor Pink and Sharp   C/D Ratio 0.7 0.5   Macula Flat w/ macula reattached, good foveal reflex, SRF improved; +demarcation line inf macula; +RPE mottling Flat, Good foveal reflex, mild Retinal pigment epithelial mottling, No heme or edema   Vessels mild Vascular attenuation, mild  Copper wiring, AV crossing changes Vascular attenuation, mild Copper wiring, AV crossing changes   Periphery Retina reattached with vast improvement in SRF; good cryo changes around retinal breaks; good early laser change SN quadrant, pre-op: bullous superior temporal RD starting at 0900 to 0100. Horse shoe retinal tear with bridging vessel  superiorly (1100); lattice with retinal holes at 1030,  Attached, pigmented lattice at 5366-4403 - good laser changes surrounding patches of lattice            IMAGING AND PROCEDURES  Imaging and Procedures for _0 @  OCT, Retina - OU - Both Eyes       Right Eye Quality was good. Central Foveal Thickness: 264. Progression has improved. Findings include subretinal fluid, normal foveal contour, no IRF, outer retinal atrophy (retina stably reattached; tr interval improvement in shallow SRF at demarcation line ).   Left Eye Quality was good. Central Foveal Thickness: 278. Progression has been stable. Findings include normal foveal contour, no SRF, no IRF.   Notes *Images captured and stored on drive  Diagnosis / Impression:  OD: retina stably reattached; tr SRF at demarcation line -- improved OS: NFP, no IRF/SRF  Clinical management:  See below  Abbreviations: NFP - Normal foveal profile. CME - cystoid macular edema. PED - pigment epithelial detachment. IRF - intraretinal fluid. SRF - subretinal fluid. EZ - ellipsoid zone. ERM - epiretinal membrane. ORA - outer retinal atrophy. ORT - outer retinal tubulation. SRHM - subretinal hyper-reflective material               ASSESSMENT/PLAN:    ICD-10-CM   1. Right retinal detachment  H33.21   2. 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 Retinal holes, bilateral  H33.323   5. Retinal edema  H35.81 OCT, Retina - OU - Both Eyes  6. Diabetes mellitus type 2 without retinopathy ([place])  E11.9   7. Essential hypertension  I10   8. Hypertensive  retinopathy of both eyes  H35.033   9. Combined forms of age-related cataract of both eyes  H25.813     1,2. Rhegmatogenous retinal detachment, right eye  - s/p laser retinopexy OD for tear/focal RD and lattice (3.4.20)  - 7.23.2020: acute bullous superior mac off detachment, onset of foveal involvement Thursday, 07/02/19 by history  - detached from 0900 to 0100 oclock, fovea off, horseshoe tear at 1100 and patches of lattice w/ retinal holes at 1030  - s/p pneumatic cryopexy OD in office (07.23.20) -- good cryo changes surrounding breaks from 1030-1100  - supplemental fill-in laser (07.29.20) - good laser changes  - gas bubble gone  - tr residual SRF at demarcation -- slightly improved today  - BCVA stable at 20/30  - IOP 13 OD   - decrease Cosopt to QD OD  - f/u 3 weeks  3,4.Lattice degeneration w/ atrophic holes, both eyes  - patches of lattice located at 0730, 0800 and 1000 OD, 0430, 0530-0600 OS  - s/p laser retinopexy OD (03.04.20) -- good laser changes in place  - s/p laser retinopexy OS (03.12.20) -- good laser changes in place  - monitor  5. No retinal edema on exam or OCT OS  6. Diabetes mellitus, type 2 without retinopathy  - The incidence, risk factors for progression, natural history and treatment options for diabetic retinopathy  were discussed with patient.    - The need for close monitoring of blood glucose, blood pressure, and serum lipids, avoiding cigarette or any type of tobacco, and the need for long term follow up was also discussed with patient.  7,8. Hypertensive retinopathy OU  - discussed importance of tight BP control  - monitor  9. Mild Mixed for age related cataracts OU  - The symptoms of cataract, surgical  options, and treatments and risks were discussed with patient.  - discussed diagnosis and progression  - not yet visually significant  - monitor for now  Ophthalmic Meds Ordered this visit:  No orders of the defined types were placed in this  encounter.      Return for f/u 3-4 weeks RD OD, DFE, OCT.  There are no Patient Instructions on file for this visit.   Explained the diagnoses, plan, and follow up with the patient and they expressed understanding.  Patient expressed understanding of the importance of proper follow up care.   This document serves as a record of services personally performed by Gardiner Sleeper, MD, PhD. It was created on their behalf by Ernest Mallick, OA, an ophthalmic assistant. The creation of this record is the provider's dictation and/or activities during the visit.    Electronically signed by: Ernest Mallick, OA  09.11.2020 12:16 AM    Gardiner Sleeper, M.D., Ph.D. Diseases & Surgery of the Retina and Vitreous [facility]  I have reviewed the above documentation for accuracy and completeness, and I agree with the above. Gardiner Sleeper, M.D., Ph.D. 08/24/19 12:18 AM    Abbreviations: M myopia (nearsighted); A astigmatism; H hyperopia (farsighted); P presbyopia; Mrx spectacle prescription;  CTL contact lenses; OD right eye; OS left eye; OU both eyes  XT exotropia; ET esotropia; PEK punctate epithelial keratitis; PEE punctate epithelial erosions; DES dry eye syndrome; MGD meibomian gland dysfunction; ATs artificial tears; PFAT's preservative free artificial tears; [place] nuclear sclerotic cataract; PSC posterior subcapsular cataract; ERM epi-retinal membrane; PVD posterior vitreous detachment; RD retinal detachment; DM diabetes mellitus; DR diabetic retinopathy; NPDR non-proliferative diabetic retinopathy; PDR proliferative diabetic retinopathy; CSME clinically significant macular edema; DME diabetic macular edema; dbh dot blot hemorrhages; CWS cotton wool spot; POAG primary open angle glaucoma; C/D cup-to-disc ratio; HVF humphrey visual field; GVF goldmann visual field; OCT optical coherence tomography; IOP intraocular pressure; BRVO Branch retinal vein occlusion; CRVO central retinal  vein occlusion; CRAO central retinal artery occlusion; BRAO branch retinal artery occlusion; RT retinal tear; SB scleral buckle; PPV pars plana vitrectomy; VH Vitreous hemorrhage; PRP panretinal laser photocoagulation; IVK intravitreal kenalog; VMT vitreomacular traction; MH Macular hole;  NVD neovascularization of the disc; NVE neovascularization elsewhere; AREDS age related eye disease study; ARMD age related macular degeneration; POAG primary open angle glaucoma; EBMD epithelial/anterior basement membrane dystrophy; ACIOL anterior chamber intraocular lens; IOL intraocular lens; PCIOL posterior chamber intraocular lens; Phaco/IOL phacoemulsification with intraocular lens placement; [place] photorefractive keratectomy; LASIK laser assisted in situ keratomileusis; HTN hypertension; DM diabetes mellitus; COPD chronic obstructive pulmonary disease

## 2019-08-24 ENCOUNTER — Encounter (INDEPENDENT_AMBULATORY_CARE_PROVIDER_SITE_OTHER): Payer: Self-pay | Admitting: Ophthalmology

## 2019-09-09 NOTE — Progress Notes (Signed)
[facility] Note  09/11/2019     CHIEF COMPLAINT Patient presents for Post-op Follow-up   HISTORY OF PRESENT ILLNESS: Nancy [name] is a 58 y.o. female who presents to the clinic today for:   HPI    Post-op Follow-up    In right eye.  Discomfort includes none.  Vision is improved.  I, the attending physician,  performed the HPI with the patient and updated documentation appropriately.          Comments    BS: 107 this AM Patient states vision is stable.  Patient states she has been able to take off her glasses to watch TV lately.  She denies eye pain or discomfort.  Patient denies any new or worsening floaters or fol OU.       Last edited by Bernarda Caffey, MD on 09/12/2019  8:20 PM. (History)    pt states she is using cosopt QD and is no longer using Pred  Referring physician: Redmond School, MD [place],  [place] [address]  HISTORICAL INFORMATION:   Selected notes from the MEDICAL RECORD NUMBER Referred by Dr. Madelin Headings for concern of retinal holes OD LEE: 02.26.20 (M. Cotter) [BCVA: 20/20 OU] Ocular Hx- PMH-DM, HLD, sickle cell trait    CURRENT MEDICATIONS: Current Outpatient Medications (Ophthalmic Drugs)  Medication Sig  . neomycin-polymyxin-dexameth (MAXITROL) 0.1 % OINT Place 1 application into the right eye 4 (four) times daily. (Patient not taking: Reported on 08/21/2019)   No current facility-administered medications for this visit.  (Ophthalmic Drugs)   Current Outpatient Medications (Other)  Medication Sig  . albuterol (PROVENTIL HFA;VENTOLIN HFA) 108 (90 BASE) MCG/ACT inhaler Inhale 2 puffs into the lungs every 6 (six) hours as needed.  . Blood Glucose Monitoring Suppl (BLOOD GLUCOSE METER) kit Use as instructed, check sugars one time a day at least every other day.  Marland Kitchen FARXIGA 10 MG TABS tablet   . fluticasone (FLONASE) 50 MCG/ACT nasal spray fluticasone propionate 50 mcg/actuation nasal spray,suspension   . glipiZIDE (GLUCOTROL) 5 MG tablet glipizide 5 mg tablet  . Lancets (ONETOUCH DELICA PLUS [id number]) MISC USE 1 TO CHECK GLUCOSE TWICE DAILY  . lisinopril (PRINIVIL,ZESTRIL) 2.5 MG tablet lisinopril 2.5 mg tablet  . lovastatin (MEVACOR) 10 MG tablet lovastatin 10 mg tablet  . methocarbamol (ROBAXIN) 500 MG tablet Take 1 or 2 po Q 6hrs for muscle pain  . naproxen (NAPROSYN) 250 MG tablet Take 1 po BID with food prn pain  . ONE TOUCH ULTRA TEST test strip USE 1 STRIP TO CHECK GLUCOSE ONCE TO TWICE DAILY   No current facility-administered medications for this visit.  (Other)      REVIEW OF SYSTEMS: ROS    Positive for: Endocrine, Eyes   Negative for: Constitutional, Gastrointestinal, Neurological, Skin, Genitourinary, Musculoskeletal, HENT, Cardiovascular, Respiratory, Psychiatric, Allergic/Imm, Heme/Lymph   Last edited by Doneen Poisson on 09/11/2019  1:40 PM. (History)       ALLERGIES Allergies  Allergen Reactions  . Cantaloupe (Diagnostic) Anaphylaxis  . Strawberry Extract Anaphylaxis  . Watermelon Flavor Anaphylaxis  . Metformin And Related Other (See Comments)    Lethargic, swelling of the eyes, drowsiness  . Penicillins Hives    PAST MEDICAL HISTORY Past Medical History:  Diagnosis Date  . Asthma   . Cataract    OU  . Diabetes mellitus without complication ([facility])   . Hypertension   . Hypertensive retinopathy    OU  . Retinal detachment 07/02/2019  OD   Past Surgical History:  Procedure Laterality Date  . BREAST SURGERY    . REPLACEMENT TOTAL KNEE    . RETINAL DETACHMENT SURGERY Right 07/02/2019   Pneumatic retinopexy - Dr. Bernarda Caffey  . right foot      FAMILY HISTORY Family History  Problem Relation Age of Onset  . Anesthesia problems Other        family history   . Diabetes Other        family history   . Glaucoma Sister   . Glaucoma Brother     SOCIAL HISTORY Social History   Tobacco Use  . Smoking status: Never Smoker  . Smokeless  tobacco: Never Used  Substance Use Topics  . Alcohol use: No  . Drug use: No         OPHTHALMIC EXAM:  Base Eye Exam    Visual Acuity (Snellen - Linear)      Right Left   Dist cc 20/30 -1 20/20   Dist ph cc 20/30 +1    Correction: Glasses       Tonometry (Tonopen, 1:41 PM)      Right Left   Pressure 14 15       Pupils      Pupils Dark Light Shape React APD   Right PERRL 3 2 Round Brisk 0   Left PERRL 3 2 Round Brisk 0       Visual Fields      Left Right    Full Full       Extraocular Movement      Right Left    Full Full       Neuro/Psych    Oriented x3: Yes   Mood/Affect: Normal       Dilation    Both eyes: 1.0% Mydriacyl, 2.5% Phenylephrine @ 1:41 PM       Dilation #2    Both eyes: 1.0% Mydriacyl, 10 % phenyepherine @ 1:45 PM        Slit Lamp and Fundus Exam    Slit Lamp Exam      Right Left   Lids/Lashes Dermatochalasis - upper lid, Meibomian gland dysfunction Dermatochalasis - upper lid, mild Meibomian gland dysfunction   Conjunctiva/Sclera mild Melanosis; mild pneumatic chemosis; 1+ injection; mild [place] ST quad mild Melanosis   Cornea mild Arcus, trace Punctate epithelial erosions mild Arcus, 1+ Punctate epithelial erosions   Anterior Chamber Deep and quiet Deep and quiet   Iris Round and dilated, No NVI Round and dilated, No NVI   Lens 1+ Nuclear sclerosis,1- 2+ Cortical cataract 1+ Nuclear sclerosis, 1+ Cortical cataract   Vitreous Vitreous syneresis, +mild pigment, PVD, gas bubble gone, vitreous condensations Vitreous syneresis, Posterior vitreous detachment       Fundus Exam      Right Left   Disc Sharp rim; +cupping; mild pallor Pink and Sharp   C/D Ratio 0.7 0.5   Macula Flat w/ macula reattached, good foveal reflex, SRF improved; +demarcation line inf macula; +RPE mottling Flat, Good foveal reflex, mild Retinal pigment epithelial mottling, No heme or edema   Vessels mild Vascular attenuation, mild Copper wiring, AV crossing changes  Vascular attenuation, mild Copper wiring, AV crossing changes   Periphery Retina reattached with vast improvement in SRF; good cryo changes around retinal breaks; good early laser change SN quadrant, pre-op: bullous superior temporal RD starting at 0900 to 0100. Horse shoe retinal tear with bridging vessel superiorly (1100); lattice with retinal holes at 1030, pigmented lattice  at 0730 with good laser surrounding Attached, pigmented lattice from 3818-2993 - good laser changes surrounding patches of lattice          Refraction    Wearing Rx      Sphere Cylinder Axis   Right -6.25 +1.50 093   Left -5.25 +1.25 090   Type: SVL          IMAGING AND PROCEDURES  Imaging and Procedures for _0 @  OCT, Retina - OU - Both Eyes       Right Eye Quality was good. Central Foveal Thickness: 262. Progression has been stable. Findings include subretinal fluid, normal foveal contour, no IRF, outer retinal atrophy (retina stably reattached; tr persistent shallow SRF at demarcation line ).   Left Eye Quality was good. Central Foveal Thickness: 271. Progression has been stable. Findings include normal foveal contour, no SRF, no IRF.   Notes *Images captured and stored on drive  Diagnosis / Impression:  OD: retina stably reattached; tr SRF at demarcation line -- persistent OS: NFP, no IRF/SRF  Clinical management:  See below  Abbreviations: NFP - Normal foveal profile. CME - cystoid macular edema. PED - pigment epithelial detachment. IRF - intraretinal fluid. SRF - subretinal fluid. EZ - ellipsoid zone. ERM - epiretinal membrane. ORA - outer retinal atrophy. ORT - outer retinal tubulation. SRHM - subretinal hyper-reflective material               ASSESSMENT/PLAN:    ICD-10-CM   1. Right retinal detachment  H33.21   2. 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 Retinal holes, bilateral  H33.323   5. Retinal edema  H35.81 OCT, Retina -  OU - Both Eyes  6. Diabetes mellitus type 2 without retinopathy ([place])  E11.9   7. Essential hypertension  I10   8. Hypertensive retinopathy of both eyes  H35.033   9. Combined forms of age-related cataract of both eyes  H25.813     1,2. Rhegmatogenous retinal detachment, right eye  - s/p laser retinopexy OD for tear/focal RD and lattice (3.4.20)  - 7.23.2020: acute bullous superior mac off detachment, onset of foveal involvement Thursday, 07/02/19 by history  - detached from 0900 to 0100 oclock, fovea off, horseshoe tear at 1100 and patches of lattice w/ retinal holes at 1030  - s/p pneumatic cryopexy OD in office (07.23.20) -- good cryo changes surrounding breaks from 1030-1100  - supplemental fill-in laser (07.29.20) - good laser changes  - tr residual SRF at demarcation -- persistent  - BCVA stable at 20/30  - IOP 14 OD   - decrease Cosopt to QD OD -- okay to stop  - f/u 6 weeks  3,4.Lattice degeneration w/ atrophic holes, both eyes  - patches of lattice located at 0730, 0800 and 1000 OD, 0430, 0530-0600 OS  - s/p laser retinopexy OD (03.04.20) -- good laser changes in place  - s/p laser retinopexy OS (03.12.20) -- good laser changes in place  - monitor  5. No retinal edema on exam or OCT OS  6. Diabetes mellitus, type 2 without retinopathy  - The incidence, risk factors for progression, natural history and treatment options for diabetic retinopathy  were discussed with patient.    - The need for close monitoring of blood glucose, blood pressure, and serum lipids, avoiding cigarette or any type of tobacco, and the need for long term follow up was also discussed with patient.  7,8.  Hypertensive retinopathy OU  - discussed importance of tight BP control  - monitor  9. 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  - discussed diagnosis and progression  - not yet visually  significant  - monitor for now  Ophthalmic Meds Ordered this visit:  No orders of the defined types were placed in this encounter.      Return in about 6 weeks (around 10/23/2019) for f/u RD OD, DFE, OCT.  There are no Patient Instructions on file for this visit.   Explained the diagnoses, plan, and follow up with the patient and they expressed understanding.  Patient expressed understanding of the importance of proper follow up care.   This document serves as a record of services personally performed by Gardiner Sleeper, MD, PhD. It was created on their behalf by Roselee Nova, COMT. The creation of this record is the provider's dictation and/or activities during the visit.  Electronically signed by: Roselee Nova, COMT 09/12/19 8:26 PM   Gardiner Sleeper, M.D., Ph.D. Diseases & Surgery of the Retina and Vitreous [facility]   I have reviewed the above documentation for accuracy and completeness, and I agree with the above. Gardiner Sleeper, M.D., Ph.D. 09/12/19 8:26 PM       Abbreviations: M myopia (nearsighted); A astigmatism; H hyperopia (farsighted); P presbyopia; Mrx spectacle prescription;  CTL contact lenses; OD right eye; OS left eye; OU both eyes  XT exotropia; ET esotropia; PEK punctate epithelial keratitis; PEE punctate epithelial erosions; DES dry eye syndrome; MGD meibomian gland dysfunction; ATs artificial tears; PFAT's preservative free artificial tears; [place] nuclear sclerotic cataract; PSC posterior subcapsular cataract; ERM epi-retinal membrane; PVD posterior vitreous detachment; RD retinal detachment; DM diabetes mellitus; DR diabetic retinopathy; NPDR non-proliferative diabetic retinopathy; PDR proliferative diabetic retinopathy; CSME clinically significant macular edema; DME diabetic macular edema; dbh dot blot hemorrhages; CWS cotton wool spot; POAG primary open angle glaucoma; C/D cup-to-disc ratio; HVF humphrey visual field; GVF goldmann  visual field; OCT optical coherence tomography; IOP intraocular pressure; BRVO Branch retinal vein occlusion; CRVO central retinal vein occlusion; CRAO central retinal artery occlusion; BRAO branch retinal artery occlusion; RT retinal tear; SB scleral buckle; PPV pars plana vitrectomy; VH Vitreous hemorrhage; PRP panretinal laser photocoagulation; IVK intravitreal kenalog; VMT vitreomacular traction; MH Macular hole;  NVD neovascularization of the disc; NVE neovascularization elsewhere; AREDS age related eye disease study; ARMD age related macular degeneration; POAG primary open angle glaucoma; EBMD epithelial/anterior basement membrane dystrophy; ACIOL anterior chamber intraocular lens; IOL intraocular lens; PCIOL posterior chamber intraocular lens; Phaco/IOL phacoemulsification with intraocular lens placement; [place] photorefractive keratectomy; LASIK laser assisted in situ keratomileusis; HTN hypertension; DM diabetes mellitus; COPD chronic obstructive pulmonary disease

## 2019-09-11 ENCOUNTER — Ambulatory Visit (INDEPENDENT_AMBULATORY_CARE_PROVIDER_SITE_OTHER): Payer: BC Managed Care – PPO | Admitting: Ophthalmology

## 2019-09-11 ENCOUNTER — Other Ambulatory Visit: Payer: Self-pay

## 2019-09-11 DIAGNOSIS — H25813 Combined forms of age-related cataract, bilateral: Secondary | ICD-10-CM

## 2019-09-11 DIAGNOSIS — H35033 Hypertensive retinopathy, bilateral: Secondary | ICD-10-CM

## 2019-09-11 DIAGNOSIS — H3581 Retinal edema: Secondary | ICD-10-CM

## 2019-09-11 DIAGNOSIS — I10 Essential (primary) hypertension: Secondary | ICD-10-CM

## 2019-09-11 DIAGNOSIS — H35413 Lattice degeneration of retina, bilateral: Secondary | ICD-10-CM

## 2019-09-11 DIAGNOSIS — H3321 Serous retinal detachment, right eye: Secondary | ICD-10-CM

## 2019-09-11 DIAGNOSIS — H33311 Horseshoe tear of retina without detachment, right eye: Secondary | ICD-10-CM

## 2019-09-11 DIAGNOSIS — E119 Type 2 diabetes mellitus without complications: Secondary | ICD-10-CM

## 2019-09-11 DIAGNOSIS — H33323 Round hole, bilateral: Secondary | ICD-10-CM

## 2019-09-12 ENCOUNTER — Encounter (INDEPENDENT_AMBULATORY_CARE_PROVIDER_SITE_OTHER): Payer: Self-pay | Admitting: Ophthalmology

## 2019-10-22 NOTE — Progress Notes (Signed)
[facility] Note  10/27/2019     CHIEF COMPLAINT Patient presents for Retina Follow Up   HISTORY OF PRESENT ILLNESS: Nancy [name] is a 58 y.o. female who presents to the clinic today for:   HPI    Retina Follow Up    Patient presents with  Retinal Break/Detachment.  In right eye.  This started weeks ago.  Severity is moderate.  Duration of weeks.  Since onset it is stable.  I, the attending physician,  performed the HPI with the patient and updated documentation appropriately.          Comments    Patient states her vision is stable OU.  She complains of glare during the day in the sunlight and states she may want to get new glasses but wants advice on when to do that.  Patient complains of kaleidoscope effect OD (temporally) when looking at phone, and arc of light (superiorly) when laying down at night.  She denies any increase in floaters or new floaters in either eye.       Last edited by Bernarda Caffey, MD on 10/27/2019 11:11 AM. (History)    pt states sometimes when it's bright outside she sees a "waviness", but if she wears dark glasses it evens things out, she states she doesn't see the waviness when look at things up close, she states she sees "kaleidescope" vision ST occasionally also, pt is only using AT's PRN  Referring physician: Madelin Headings, DO [address] [place],  [place] [address]  HISTORICAL INFORMATION:   Selected notes from the MEDICAL RECORD NUMBER Referred by Dr. Madelin Headings for concern of retinal holes OD    CURRENT MEDICATIONS: Current Outpatient Medications (Ophthalmic Drugs)  Medication Sig  . neomycin-polymyxin-dexameth (MAXITROL) 0.1 % OINT Place 1 application into the right eye 4 (four) times daily. (Patient not taking: Reported on 08/21/2019)   No current facility-administered medications for this visit.  (Ophthalmic Drugs)   Current Outpatient Medications (Other)  Medication Sig  . albuterol (PROVENTIL  HFA;VENTOLIN HFA) 108 (90 BASE) MCG/ACT inhaler Inhale 2 puffs into the lungs every 6 (six) hours as needed.  . Blood Glucose Monitoring Suppl (BLOOD GLUCOSE METER) kit Use as instructed, check sugars one time a day at least every other day.  Marland Kitchen FARXIGA 10 MG TABS tablet   . fluticasone (FLONASE) 50 MCG/ACT nasal spray fluticasone propionate 50 mcg/actuation nasal spray,suspension  . glipiZIDE (GLUCOTROL) 5 MG tablet glipizide 5 mg tablet  . Lancets (ONETOUCH DELICA PLUS CVELFY10F) MISC USE 1 TO CHECK GLUCOSE TWICE DAILY  . lisinopril (PRINIVIL,ZESTRIL) 2.5 MG tablet lisinopril 2.5 mg tablet  . lovastatin (MEVACOR) 10 MG tablet lovastatin 10 mg tablet  . methocarbamol (ROBAXIN) 500 MG tablet Take 1 or 2 po Q 6hrs for muscle pain  . naproxen (NAPROSYN) 250 MG tablet Take 1 po BID with food prn pain  . ONE TOUCH ULTRA TEST test strip USE 1 STRIP TO CHECK GLUCOSE ONCE TO TWICE DAILY   No current facility-administered medications for this visit.  (Other)      REVIEW OF SYSTEMS: ROS    Positive for: Endocrine, Eyes   Negative for: Constitutional, Gastrointestinal, Neurological, Skin, Genitourinary, Musculoskeletal, HENT, Cardiovascular, Respiratory, Psychiatric, Allergic/Imm, Heme/Lymph   Last edited by Doneen Poisson on 10/27/2019 10:21 AM. (History)       ALLERGIES Allergies  Allergen Reactions  . Cantaloupe (Diagnostic) Anaphylaxis  . Strawberry Extract Anaphylaxis  . Watermelon Flavor Anaphylaxis  . Metformin  And Related Other (See Comments)    Lethargic, swelling of the eyes, drowsiness  . Penicillins Hives    PAST MEDICAL HISTORY Past Medical History:  Diagnosis Date  . Asthma   . Cataract    OU  . Diabetes mellitus without complication ([place])   . Hypertension   . Hypertensive retinopathy    OU  . Retinal detachment 07/02/2019   OD   Past Surgical History:  Procedure Laterality Date  . BREAST SURGERY    . REPLACEMENT TOTAL KNEE    . RETINAL DETACHMENT SURGERY  Right 07/02/2019   Pneumatic retinopexy - Dr. Bernarda Caffey  . right foot      FAMILY HISTORY Family History  Problem Relation Age of Onset  . Anesthesia problems Other        family history   . Diabetes Other        family history   . Glaucoma Sister   . Glaucoma Brother     SOCIAL HISTORY Social History   Tobacco Use  . Smoking status: Never Smoker  . Smokeless tobacco: Never Used  Substance Use Topics  . Alcohol use: No  . Drug use: No         OPHTHALMIC EXAM:  Base Eye Exam    Visual Acuity (Snellen - Linear)      Right Left   Dist cc 20/40 -1 20/20   Dist ph cc NI    Correction: Glasses       Tonometry (Tonopen, 10:25 AM)      Right Left   Pressure 14 15       Pupils      Dark Light Shape React APD   Right 3 2 Round Brisk 0   Left 3 2 Round Brisk 0       Visual Fields      Left Right    Full Full       Extraocular Movement      Right Left    Full Full       Neuro/Psych    Oriented x3: Yes   Mood/Affect: Normal       Dilation    Both eyes: 1.0% Mydriacyl, 2.5% Phenylephrine @ 10:25 AM        Slit Lamp and Fundus Exam    Slit Lamp Exam      Right Left   Lids/Lashes Dermatochalasis - upper lid, Meibomian gland dysfunction Dermatochalasis - upper lid, mild Meibomian gland dysfunction   Conjunctiva/Sclera mild Melanosis; mild pneumatic chemosis; 1+ injection; mild [place] ST quad mild Melanosis   Cornea mild Arcus, 1-2+inferior Punctate epithelial erosions mild Arcus, 1+ Punctate epithelial erosions   Anterior Chamber Deep and quiet Deep and quiet   Iris Round and dilated, No NVI Round and dilated, No NVI   Lens 2+ Nuclear sclerosis, 2+ Cortical cataract 1+ Nuclear sclerosis, 1+ Cortical cataract   Vitreous Vitreous syneresis, PVD, pigment stained vitreous condensations Vitreous syneresis, Posterior vitreous detachment       Fundus Exam      Right Left   Disc Sharp rim; +cupping; mild pallor Pink and Sharp   C/D Ratio 0.7 0.5   Macula  Flat w/ macula reattached, good foveal reflex, SRF improved; +demarcation line inf macula; +RPE mottling Flat, Good foveal reflex, mild Retinal pigment epithelial mottling, No heme or edema   Vessels mild Vascular attenuation Vascular attenuation, mild Copper wiring, AV crossing changes   Periphery Retina reattached with stably improved in SRF; good cryo changes around at  1200; good laser changes from 1000-1230, pre-op: bullous superior temporal RD starting at 0900 to 0100. Horse shoe retinal tear with bridging vessel superiorly (1100); lattice with retinal holes at 1030, pigmented lattice at 0730 with good laser surrounding Attached, pigmented lattice from 1610-9604 - good laser changes surrounding patches of lattice          Refraction    Wearing Rx      Sphere Cylinder Axis   Right -6.25 +1.50 093   Left -5.25 +1.25 090   Type: SVL          IMAGING AND PROCEDURES  Imaging and Procedures for _0 @  OCT, Retina - OU - Both Eyes       Right Eye Quality was good. Central Foveal Thickness: 264. Progression has been stable. Findings include subretinal fluid, normal foveal contour, no IRF, outer retinal atrophy (retina stably reattached; tr persistent shallow SRF at demarcation line -- slightly improved).   Left Eye Quality was good. Central Foveal Thickness: 2723. Progression has been stable. Findings include normal foveal contour, no SRF, no IRF.   Notes *Images captured and stored on drive  Diagnosis / Impression:  OD: retina stably reattached; tr SRF at demarcation line -- slightly improved, but persistent OS: NFP, no IRF/SRF  Clinical management:  See below  Abbreviations: NFP - Normal foveal profile. CME - cystoid macular edema. PED - pigment epithelial detachment. IRF - intraretinal fluid. SRF - subretinal fluid. EZ - ellipsoid zone. ERM - epiretinal membrane. ORA - outer retinal atrophy. ORT - outer retinal tubulation. SRHM - subretinal hyper-reflective material                ASSESSMENT/PLAN:    ICD-10-CM   1. Right retinal detachment  H33.21   2. 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 Retinal holes, bilateral  H33.323   5. Retinal edema  H35.81 OCT, Retina - OU - Both Eyes  6. Diabetes mellitus type 2 without retinopathy ([place])  E11.9   7. Essential hypertension  I10   8. Hypertensive retinopathy of both eyes  H35.033   9. Combined forms of age-related cataract of both eyes  H25.813     1,2. Rhegmatogenous retinal detachment, right eye  - s/p laser retinopexy OD for tear/focal RD and lattice (3.4.20)  - 7.23.2020: acute bullous superior mac off detachment, onset of foveal involvement Thursday, 07/02/19 by history  - detached from 0900 to 0100 oclock, fovea off, horseshoe tear at 1100 and patches of lattice w/ retinal holes at 1030  - s/p pneumatic cryopexy OD in office (07.23.20) -- good cryo changes surrounding breaks from 1030-1100  - supplemental fill-in laser (07.29.20) - good laser changes  - tr residual SRF at demarcation -- mild improvement, but still persistent  - BCVA slightly decreased to 20/40 from 20/30  - IOP 14 OD   - f/u 3-4 months   3,4.Lattice degeneration w/ atrophic holes, both eyes  - patches of lattice located at 0730, 0800 and 1000 OD, 0430, 0530-0600 OS  - s/p laser retinopexy OD (03.04.20) -- good laser changes in place  - s/p laser retinopexy OS (03.12.20) -- good laser changes in place  - monitor  5. No retinal edema on exam or OCT OS  6. Diabetes mellitus, type 2 without retinopathy  - The incidence, risk factors for progression, natural history and treatment options for diabetic retinopathy  were discussed with patient.    -  The need for close monitoring of blood glucose, blood pressure, and serum lipids, avoiding cigarette or any type of tobacco, and the need for long term follow up was also discussed with patient.  7,8. Hypertensive retinopathy OU  -  discussed importance of tight BP control  - monitor  9. 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  - discussed diagnosis and progression  - not yet visually significant  - monitor for now  Ophthalmic Meds Ordered this visit:  No orders of the defined types were placed in this encounter.      Return for f/u 3-4 months, RD OD, DFE, OCT.  There are no Patient Instructions on file for this visit.   Explained the diagnoses, plan, and follow up with the patient and they expressed understanding.  Patient expressed understanding of the importance of proper follow up care.   This document serves as a record of services personally performed by Gardiner Sleeper, MD, PhD. It was created on their behalf by Estill Bakes, COT an ophthalmic technician. The creation of this record is the provider's dictation and/or activities during the visit.    Electronically signed by: Estill Bakes, COT 10/11/11/20 @ 1:13 PM  Gardiner Sleeper, M.D., Ph.D. Diseases & Surgery of the Retina and [place] 10/27/2019   I have reviewed the above documentation for accuracy and completeness, and I agree with the above. Gardiner Sleeper, M.D., Ph.D. 10/27/19 1:13 PM   Abbreviations: M myopia (nearsighted); A astigmatism; H hyperopia (farsighted); P presbyopia; Mrx spectacle prescription;  CTL contact lenses; OD right eye; OS left eye; OU both eyes  XT exotropia; ET esotropia; PEK punctate epithelial keratitis; PEE punctate epithelial erosions; DES dry eye syndrome; MGD meibomian gland dysfunction; ATs artificial tears; PFAT's preservative free artificial tears; [place] nuclear sclerotic cataract; PSC posterior subcapsular cataract; ERM epi-retinal membrane; PVD posterior vitreous detachment; RD retinal detachment; DM diabetes mellitus; DR diabetic retinopathy; NPDR non-proliferative diabetic  retinopathy; PDR proliferative diabetic retinopathy; CSME clinically significant macular edema; DME diabetic macular edema; dbh dot blot hemorrhages; CWS cotton wool spot; POAG primary open angle glaucoma; C/D cup-to-disc ratio; HVF humphrey visual field; GVF goldmann visual field; OCT optical coherence tomography; IOP intraocular pressure; BRVO Branch retinal vein occlusion; CRVO central retinal vein occlusion; CRAO central retinal artery occlusion; BRAO branch retinal artery occlusion; RT retinal tear; SB scleral buckle; PPV pars plana vitrectomy; VH Vitreous hemorrhage; PRP panretinal laser photocoagulation; IVK intravitreal kenalog; VMT vitreomacular traction; MH Macular hole;  NVD neovascularization of the disc; NVE neovascularization elsewhere; AREDS age related eye disease study; ARMD age related macular degeneration; POAG primary open angle glaucoma; EBMD epithelial/anterior basement membrane dystrophy; ACIOL anterior chamber intraocular lens; IOL intraocular lens; PCIOL posterior chamber intraocular lens; Phaco/IOL phacoemulsification with intraocular lens placement; [place] photorefractive keratectomy; LASIK laser assisted in situ keratomileusis; HTN hypertension; DM diabetes mellitus; COPD chronic obstructive pulmonary disease

## 2019-10-27 ENCOUNTER — Encounter (INDEPENDENT_AMBULATORY_CARE_PROVIDER_SITE_OTHER): Payer: Self-pay | Admitting: Ophthalmology

## 2019-10-27 ENCOUNTER — Ambulatory Visit (INDEPENDENT_AMBULATORY_CARE_PROVIDER_SITE_OTHER): Payer: BC Managed Care – PPO | Admitting: Ophthalmology

## 2019-10-27 DIAGNOSIS — H35413 Lattice degeneration of retina, bilateral: Secondary | ICD-10-CM

## 2019-10-27 DIAGNOSIS — H33311 Horseshoe tear of retina without detachment, right eye: Secondary | ICD-10-CM

## 2019-10-27 DIAGNOSIS — H3581 Retinal edema: Secondary | ICD-10-CM

## 2019-10-27 DIAGNOSIS — E119 Type 2 diabetes mellitus without complications: Secondary | ICD-10-CM

## 2019-10-27 DIAGNOSIS — H25813 Combined forms of age-related cataract, bilateral: Secondary | ICD-10-CM

## 2019-10-27 DIAGNOSIS — H33323 Round hole, bilateral: Secondary | ICD-10-CM | POA: Diagnosis not present

## 2019-10-27 DIAGNOSIS — H3321 Serous retinal detachment, right eye: Secondary | ICD-10-CM | POA: Diagnosis not present

## 2019-10-27 DIAGNOSIS — I10 Essential (primary) hypertension: Secondary | ICD-10-CM

## 2019-10-27 DIAGNOSIS — H35033 Hypertensive retinopathy, bilateral: Secondary | ICD-10-CM

## 2020-01-27 ENCOUNTER — Other Ambulatory Visit: Payer: Self-pay

## 2020-01-27 ENCOUNTER — Ambulatory Visit: Payer: BC Managed Care – PPO | Attending: Internal Medicine

## 2020-01-27 DIAGNOSIS — Z20822 Contact with and (suspected) exposure to covid-19: Secondary | ICD-10-CM

## 2020-01-29 LAB — NOVEL CORONAVIRUS, NAA: SARS-CoV-2, NAA: NOT DETECTED

## 2020-02-23 NOTE — Progress Notes (Signed)
[facility] Note  02/24/2020     CHIEF COMPLAINT Patient presents for Retina Follow Up   HISTORY OF PRESENT ILLNESS: Nancy [name] is a 59 y.o. female who presents to the clinic today for:   HPI    Retina Follow Up    Patient presents with  Retinal Break/Detachment.  In right eye.  This started months ago.  Duration of months.  Since onset it is stable.  I, the attending physician,  performed the HPI with the patient and updated documentation appropriately.          Comments    BS: 115 this AM A1c: 6.? Pt states her vision seems about the same OD.  She has feeling of pressure OD--pt recovering from sinus infection.  Pt denies eye pain but does have discomfort from pressure OD.  Pt denies any new or worsening floaters or fol OU but does still have inferior and superior arc of light OD.       Last edited by Bernarda Caffey, MD on 02/27/2020  9:45 PM. (History)    Pt has arc of light inferior and superior OD and states it comes and goes.  VA stable per patient.   Referring physician: Redmond School, MD [place],  [place] [id number]  HISTORICAL INFORMATION:   Selected notes from the MEDICAL RECORD NUMBER Referred by Dr. Madelin Headings for concern of retinal holes OD    CURRENT MEDICATIONS: Current Outpatient Medications (Ophthalmic Drugs)  Medication Sig  . neomycin-polymyxin-dexameth (MAXITROL) 0.1 % OINT Place 1 application into the right eye 4 (four) times daily. (Patient not taking: Reported on 08/21/2019)   No current facility-administered medications for this visit. (Ophthalmic Drugs)   Current Outpatient Medications (Other)  Medication Sig  . albuterol (PROVENTIL HFA;VENTOLIN HFA) 108 (90 BASE) MCG/ACT inhaler Inhale 2 puffs into the lungs every 6 (six) hours as needed.  . Blood Glucose Monitoring Suppl (BLOOD GLUCOSE METER) kit Use as instructed, check sugars one time a day at least every other day.  Marland Kitchen FARXIGA 10 MG TABS  tablet   . fluticasone (FLONASE) 50 MCG/ACT nasal spray fluticasone propionate 50 mcg/actuation nasal spray,suspension  . glipiZIDE (GLUCOTROL) 5 MG tablet glipizide 5 mg tablet  . Lancets (ONETOUCH DELICA PLUS [id number]) MISC USE 1 TO CHECK GLUCOSE TWICE DAILY  . lisinopril (PRINIVIL,ZESTRIL) 2.5 MG tablet lisinopril 2.5 mg tablet  . lovastatin (MEVACOR) 10 MG tablet lovastatin 10 mg tablet  . methocarbamol (ROBAXIN) 500 MG tablet Take 1 or 2 po Q 6hrs for muscle pain  . naproxen (NAPROSYN) 250 MG tablet Take 1 po BID with food prn pain  . ONE TOUCH ULTRA TEST test strip USE 1 STRIP TO CHECK GLUCOSE ONCE TO TWICE DAILY   No current facility-administered medications for this visit. (Other)      REVIEW OF SYSTEMS: ROS    Positive for: Endocrine, Eyes   Negative for: Constitutional, Gastrointestinal, Neurological, Skin, Genitourinary, Musculoskeletal, HENT, Cardiovascular, Respiratory, Psychiatric, Allergic/Imm, Heme/Lymph   Last edited by Doneen Poisson on 02/24/2020  9:59 AM. (History)       ALLERGIES Allergies  Allergen Reactions  . Cantaloupe (Diagnostic) Anaphylaxis  . Strawberry Extract Anaphylaxis  . Watermelon Flavor Anaphylaxis  . Metformin And Related Other (See Comments)    Lethargic, swelling of the eyes, drowsiness  . Penicillins Hives    PAST MEDICAL HISTORY Past Medical History:  Diagnosis Date  . Asthma   . Cataract    OU  .  Diabetes mellitus without complication ([place])   . Hypertension   . Hypertensive retinopathy    OU  . Retinal detachment 07/02/2019   OD   Past Surgical History:  Procedure Laterality Date  . BREAST SURGERY    . REPLACEMENT TOTAL KNEE    . RETINAL DETACHMENT SURGERY Right 07/02/2019   Pneumatic retinopexy - Dr. Bernarda Caffey  . right foot      FAMILY HISTORY Family History  Problem Relation Age of Onset  . Anesthesia problems Other        family history   . Diabetes Other        family history   . Glaucoma Sister   .  Glaucoma Brother     SOCIAL HISTORY Social History   Tobacco Use  . Smoking status: Never Smoker  . Smokeless tobacco: Never Used  Substance Use Topics  . Alcohol use: No  . Drug use: No         OPHTHALMIC EXAM:  Base Eye Exam    Visual Acuity (Snellen - Linear)      Right Left   Dist cc 20/30 -2 20/20 -2   Dist ph cc NI    Correction: Glasses       Tonometry (Tonopen, 10:08 AM)      Right Left   Pressure 14 13       Pupils      Dark Light Shape React APD   Right 3 2 Round Brisk 0   Left 3 2 Round Brisk 0       Visual Fields      Left Right    Full    Restrictions  Partial outer superior temporal, inferior temporal, superior nasal, inferior nasal deficiencies       Extraocular Movement      Right Left    Full Full       Neuro/Psych    Oriented x3: Yes   Mood/Affect: Normal       Dilation    Both eyes: 1.0% Mydriacyl, 2.5% Phenylephrine @ 10:08 AM        Slit Lamp and Fundus Exam    Slit Lamp Exam      Right Left   Lids/Lashes Dermatochalasis - upper lid, Meibomian gland dysfunction Dermatochalasis - upper lid, mild Meibomian gland dysfunction   Conjunctiva/Sclera mild Melanosis; mild pneumatic chemosis; 1+ injection; mild [place] ST quad mild Melanosis   Cornea mild Arcus, trace Punctate epithelial erosions mild Arcus, trace Punctate epithelial erosions   Anterior Chamber Deep and quiet Deep and quiet   Iris Round and dilated, No NVI Round and dilated, No NVI   Lens 2+ Nuclear sclerosis, 2+ Cortical cataract 1-2+ Nuclear sclerosis, 1-2+ Cortical cataract   Vitreous Vitreous syneresis, PVD, pigment stained vitreous condensations, mild vitreous debris inferiorly Vitreous syneresis, Posterior vitreous detachment       Fundus Exam      Right Left   Disc Sharp rim; +cupping; mild pallor Pink and Sharp, mild cupping   C/D Ratio 0.7 0.5   Macula Flat w/ macula reattached, good foveal reflex, SRF improved; +demarcation line inf macula; +RPE mottling  Flat, Good foveal reflex, mild Retinal pigment epithelial mottling, No heme or edema   Vessels mild Vascular attenuation, Tortuous Vascular attenuation, mild Copper wiring, AV crossing changes   Periphery Retina reattached with stably improved in SRF; good cryo changes around at 1200; good laser changes from 1000-1230, pre-op: bullous superior temporal RD starting at 0900 to 0100. Horse shoe retinal tear  with bridging vessel superiorly (1100); lattice with retinal holes at 1030, pigmented lattice at 0730 with good laser surrounding Attached, pigmented lattice from 4503-8882 - good laser changes surrounding patches of lattice          Refraction    Wearing Rx      Sphere Cylinder Axis   Right -6.25 +1.50 093   Left -5.25 +1.25 090   Type: SVL          IMAGING AND PROCEDURES  Imaging and Procedures for _0 @  OCT, Retina - OU - Both Eyes       Right Eye Quality was good. Central Foveal Thickness: 270. Progression has improved. Findings include subretinal fluid, normal foveal contour, no IRF, outer retinal atrophy (retina stably reattached; tr persistent shallow SRF at demarcation line -- slightly improved).   Left Eye Quality was good. Central Foveal Thickness: 273. Progression has been stable. Findings include normal foveal contour, no SRF, no IRF.   Notes *Images captured and stored on drive  Diagnosis / Impression:  OD: retina stably reattached; tr SRF at demarcation line -- slightly improved, but persistent OS: NFP, no IRF/SRF  Clinical management:  See below  Abbreviations: NFP - Normal foveal profile. CME - cystoid macular edema. PED - pigment epithelial detachment. IRF - intraretinal fluid. SRF - subretinal fluid. EZ - ellipsoid zone. ERM - epiretinal membrane. ORA - outer retinal atrophy. ORT - outer retinal tubulation. SRHM - subretinal hyper-reflective material               ASSESSMENT/PLAN:    ICD-10-CM   1. Right retinal detachment  H33.21   2. 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 Retinal holes, bilateral  H33.323   5. Retinal edema  H35.81 OCT, Retina - OU - Both Eyes  6. Diabetes mellitus type 2 without retinopathy ([place])  E11.9   7. Essential hypertension  I10   8. Hypertensive retinopathy of both eyes  H35.033   9. Combined forms of age-related cataract of both eyes  H25.813     1,2. Rhegmatogenous retinal detachment, right eye  - s/p laser retinopexy OD for tear/focal RD and lattice (3.4.20)  - 7.23.2020: acute bullous superior mac off detachment, onset of foveal involvement Thursday, 07/02/19 by history  - detached from 0900 to 0100 oclock, fovea off, horseshoe tear at 1100 and patches of lattice w/ retinal holes at 1030  - s/p pneumatic cryopexy OD in office (07.23.20) -- good cryo changes surrounding breaks from 1030-1100  - supplemental fill-in laser (07.29.20) - good laser changes  - retina stably reattached  - tr residual SRF at demarcation -- mild improvement, but still persistent  - BCVA better from  20/40 to 20/30  - IOP 14 OD   - f/u 4-6 months  3,4.Lattice degeneration w/ atrophic holes, both eyes  - patches of lattice located at 0730, 0800 and 1000 OD, 0430, 0530-0600 OS  - s/p laser retinopexy OD (03.04.20) -- good laser changes in place  - s/p laser retinopexy OS (03.12.20) -- good laser changes in place  - monitor  5. No retinal edema on exam or OCT OS  6. Diabetes mellitus, type 2 without retinopathy  - The incidence, risk factors for progression, natural history and treatment options for diabetic retinopathy  were discussed with patient.    - The need for close monitoring of blood glucose, blood pressure, and serum lipids, avoiding cigarette or any  type of tobacco, and the need for long term follow up was also discussed with patient.  7,8. Hypertensive retinopathy OU  - discussed importance of tight BP control  - monitor  9. Mild Mixed for age related  cataracts OU  - The symptoms of cataract, surgical options, and treatments and risks were discussed with patient.  - discussed diagnosis and progression  - not yet visually significant  - monitor for now  Ophthalmic Meds Ordered this visit:  No orders of the defined types were placed in this encounter.      Return in about 5 months (around 07/26/2020) for 4-6 months RD OD, DFE OCT.  There are no Patient Instructions on file for this visit.   Explained the diagnoses, plan, and follow up with the patient and they expressed understanding.  Patient expressed understanding of the importance of proper follow up care.   This document serves as a record of services personally performed by Gardiner Sleeper, MD, PhD. It was created on their behalf by Roselee Nova, COMT. The creation of this record is the provider's dictation and/or activities during the visit.  This document serves as a record of services personally performed by Gardiner Sleeper, MD, PhD. It was created on their behalf by Leeann Must, [place], a certified ophthalmic assistant. The creation of this record is the provider's dictation and/or activities during the visit.    Electronically signed by: Leeann Must, COA _0 @ 10:02 PM  Gardiner Sleeper, M.D., Ph.D. Diseases & Surgery of the Retina and [place] 02/24/2020   I have reviewed the above documentation for accuracy and completeness, and I agree with the above. Gardiner Sleeper, M.D., Ph.D. 02/27/20 10:02 PM   Abbreviations: M myopia (nearsighted); A astigmatism; H hyperopia (farsighted); P presbyopia; Mrx spectacle prescription;  CTL contact lenses; OD right eye; OS left eye; OU both eyes  XT exotropia; ET esotropia; PEK punctate epithelial keratitis; PEE punctate epithelial erosions; DES dry eye syndrome; MGD meibomian gland dysfunction; ATs artificial tears; PFAT's preservative free artificial tears; [place] nuclear sclerotic cataract; PSC  posterior subcapsular cataract; ERM epi-retinal membrane; PVD posterior vitreous detachment; RD retinal detachment; DM diabetes mellitus; DR diabetic retinopathy; NPDR non-proliferative diabetic retinopathy; PDR proliferative diabetic retinopathy; CSME clinically significant macular edema; DME diabetic macular edema; dbh dot blot hemorrhages; CWS cotton wool spot; POAG primary open angle glaucoma; C/D cup-to-disc ratio; HVF humphrey visual field; GVF goldmann visual field; OCT optical coherence tomography; IOP intraocular pressure; BRVO Branch retinal vein occlusion; CRVO central retinal vein occlusion; CRAO central retinal artery occlusion; BRAO branch retinal artery occlusion; RT retinal tear; SB scleral buckle; PPV pars plana vitrectomy; VH Vitreous hemorrhage; PRP panretinal laser photocoagulation; IVK intravitreal kenalog; VMT vitreomacular traction; MH Macular hole;  NVD neovascularization of the disc; NVE neovascularization elsewhere; AREDS age related eye disease study; ARMD age related macular degeneration; POAG primary open angle glaucoma; EBMD epithelial/anterior basement membrane dystrophy; ACIOL anterior chamber intraocular lens; IOL intraocular lens; PCIOL posterior chamber intraocular lens; Phaco/IOL phacoemulsification with intraocular lens placement; [place] photorefractive keratectomy; LASIK laser assisted in situ keratomileusis; HTN hypertension; DM diabetes mellitus; COPD chronic obstructive pulmonary disease

## 2020-02-24 ENCOUNTER — Ambulatory Visit (INDEPENDENT_AMBULATORY_CARE_PROVIDER_SITE_OTHER): Payer: BC Managed Care – PPO | Admitting: Ophthalmology

## 2020-02-24 DIAGNOSIS — H33323 Round hole, bilateral: Secondary | ICD-10-CM | POA: Diagnosis not present

## 2020-02-24 DIAGNOSIS — H3321 Serous retinal detachment, right eye: Secondary | ICD-10-CM

## 2020-02-24 DIAGNOSIS — H35413 Lattice degeneration of retina, bilateral: Secondary | ICD-10-CM | POA: Diagnosis not present

## 2020-02-24 DIAGNOSIS — I10 Essential (primary) hypertension: Secondary | ICD-10-CM

## 2020-02-24 DIAGNOSIS — H3581 Retinal edema: Secondary | ICD-10-CM | POA: Diagnosis not present

## 2020-02-24 DIAGNOSIS — H35033 Hypertensive retinopathy, bilateral: Secondary | ICD-10-CM

## 2020-02-24 DIAGNOSIS — H25813 Combined forms of age-related cataract, bilateral: Secondary | ICD-10-CM

## 2020-02-24 DIAGNOSIS — H33311 Horseshoe tear of retina without detachment, right eye: Secondary | ICD-10-CM | POA: Diagnosis not present

## 2020-02-24 DIAGNOSIS — E119 Type 2 diabetes mellitus without complications: Secondary | ICD-10-CM

## 2020-02-27 ENCOUNTER — Encounter (INDEPENDENT_AMBULATORY_CARE_PROVIDER_SITE_OTHER): Payer: Self-pay | Admitting: Ophthalmology

## 2020-02-28 IMAGING — DX DG SHOULDER 2+V*R*
3 series · 3 of 3 positions shown · non-contrast
Comparison: None.

CLINICAL DATA: Acute RIGHT shoulder pain following injury today.
Initial encounter.

EXAM:
RIGHT SHOULDER - 2+ VIEW

[shoulder grashey]
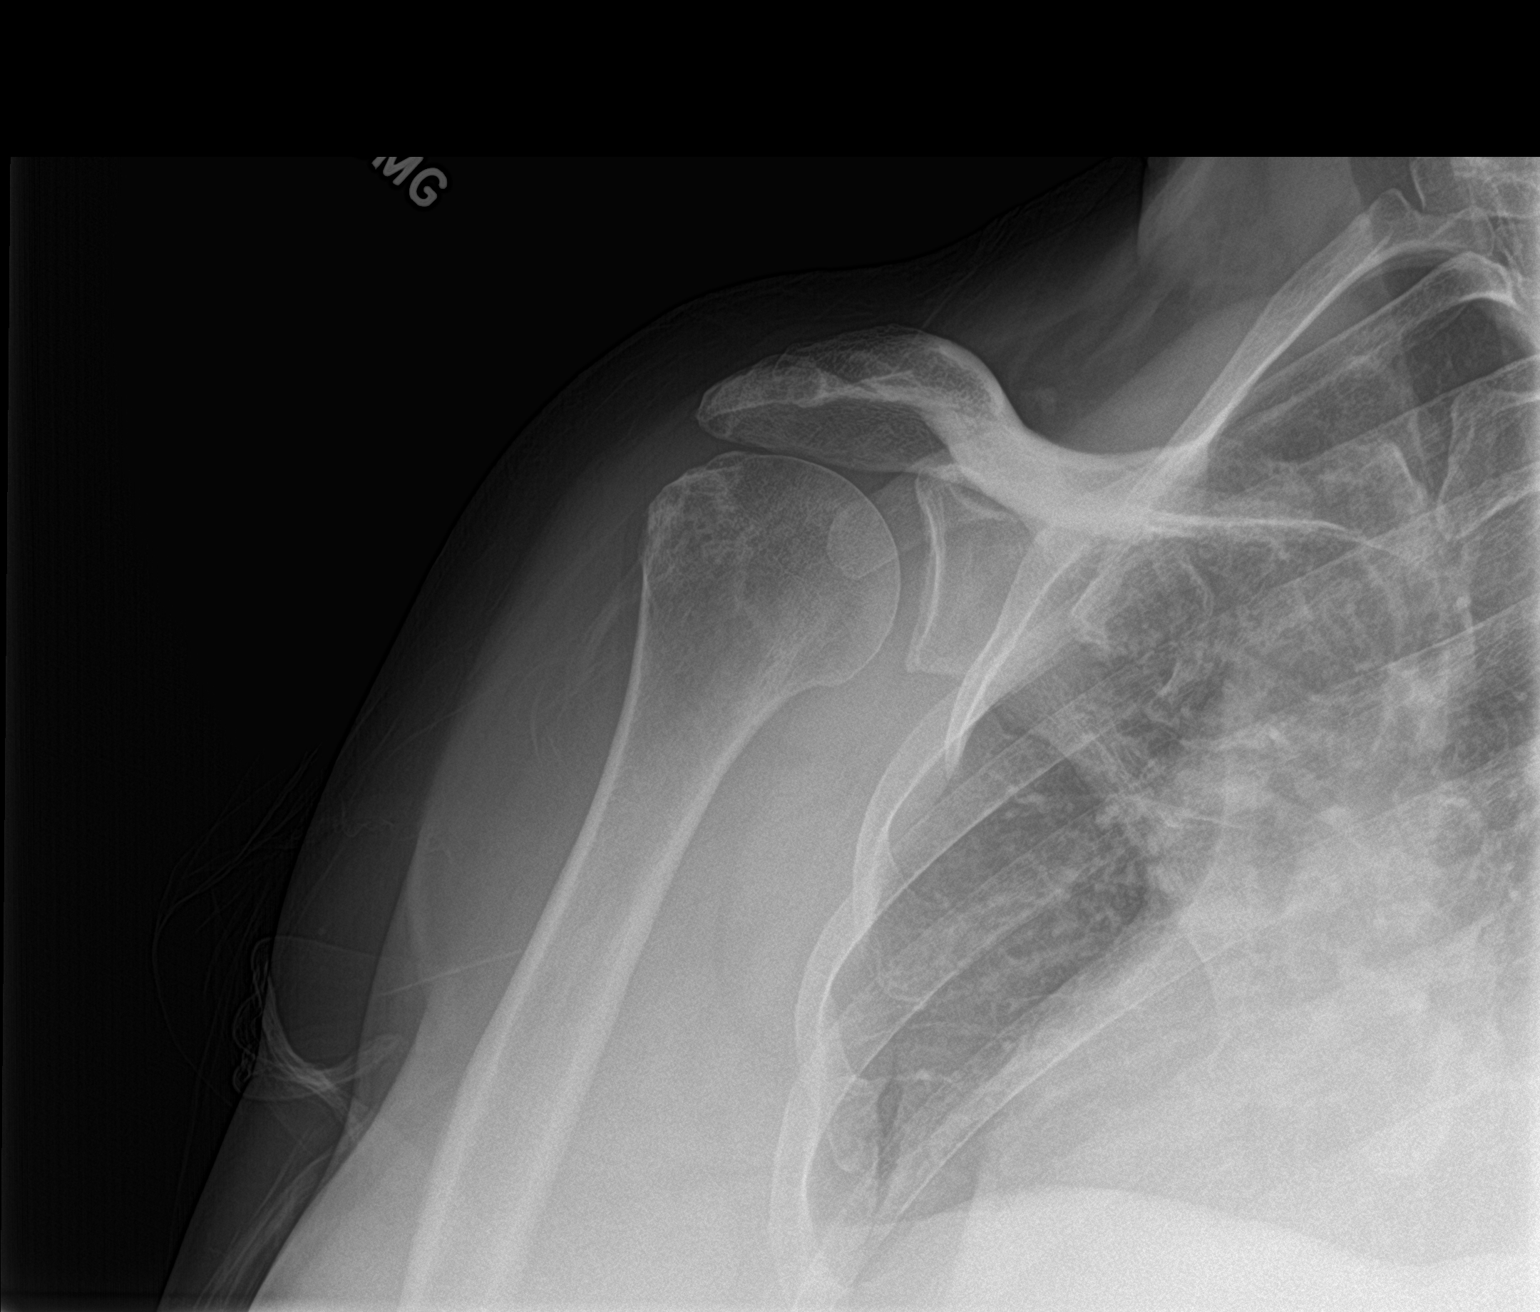

[shoulder y view (1 of 2)]
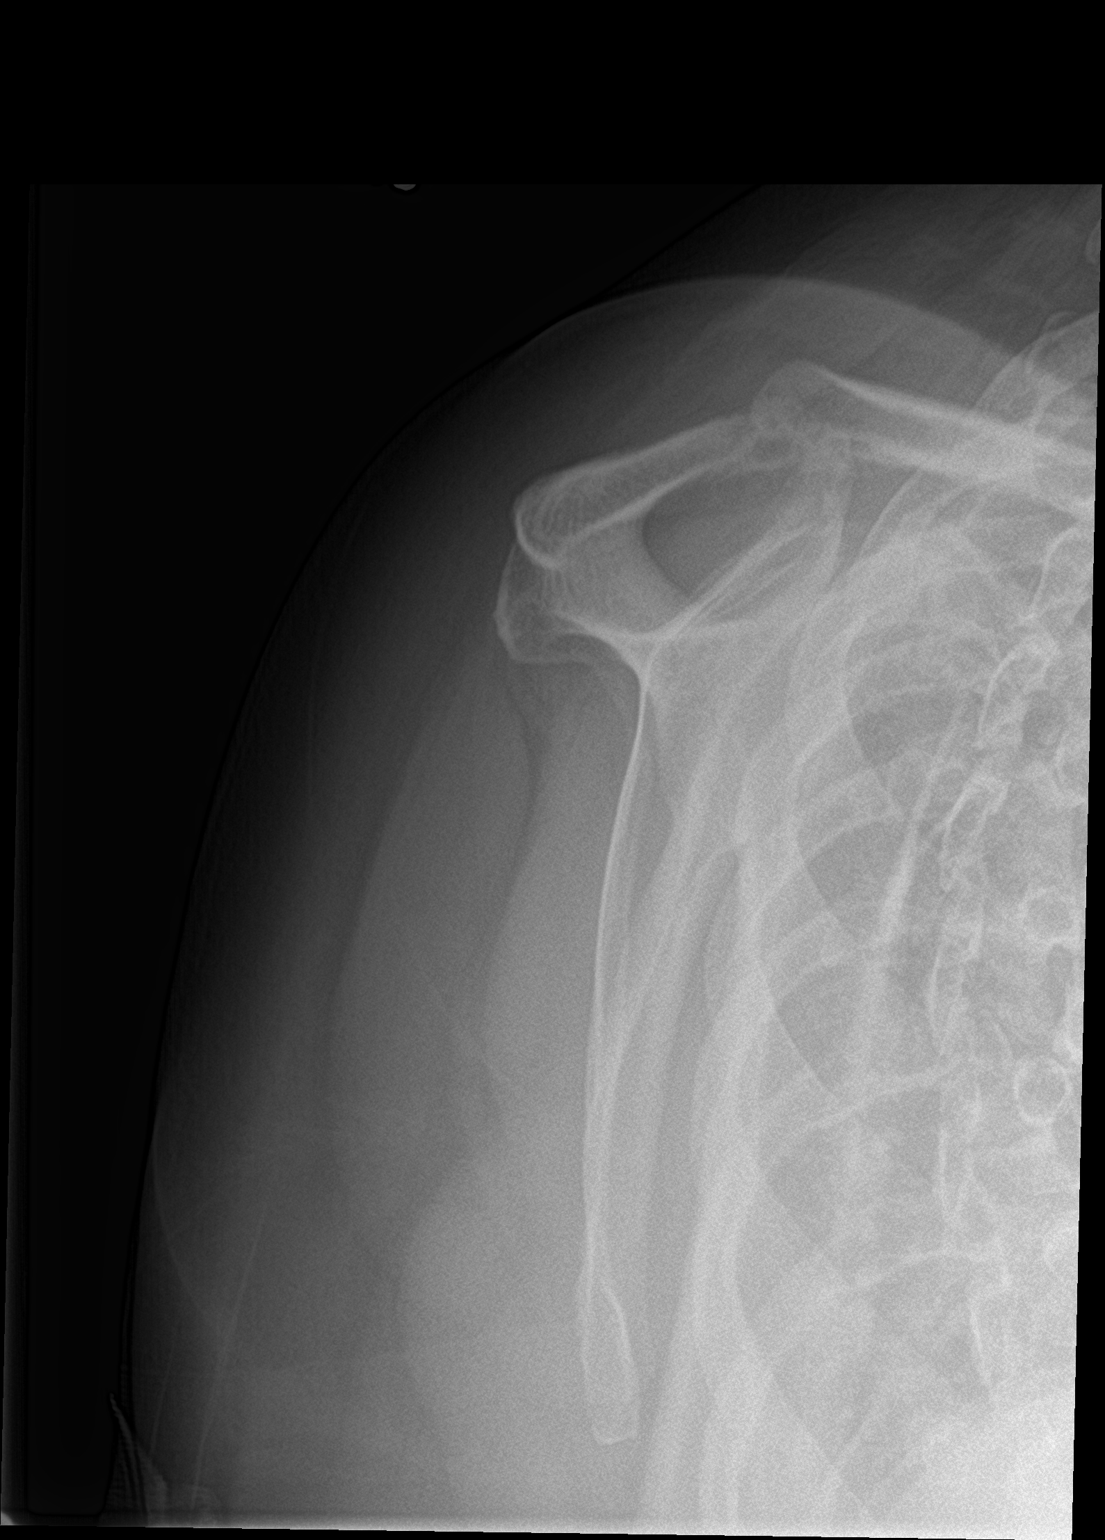

[shoulder y view (2 of 2)]
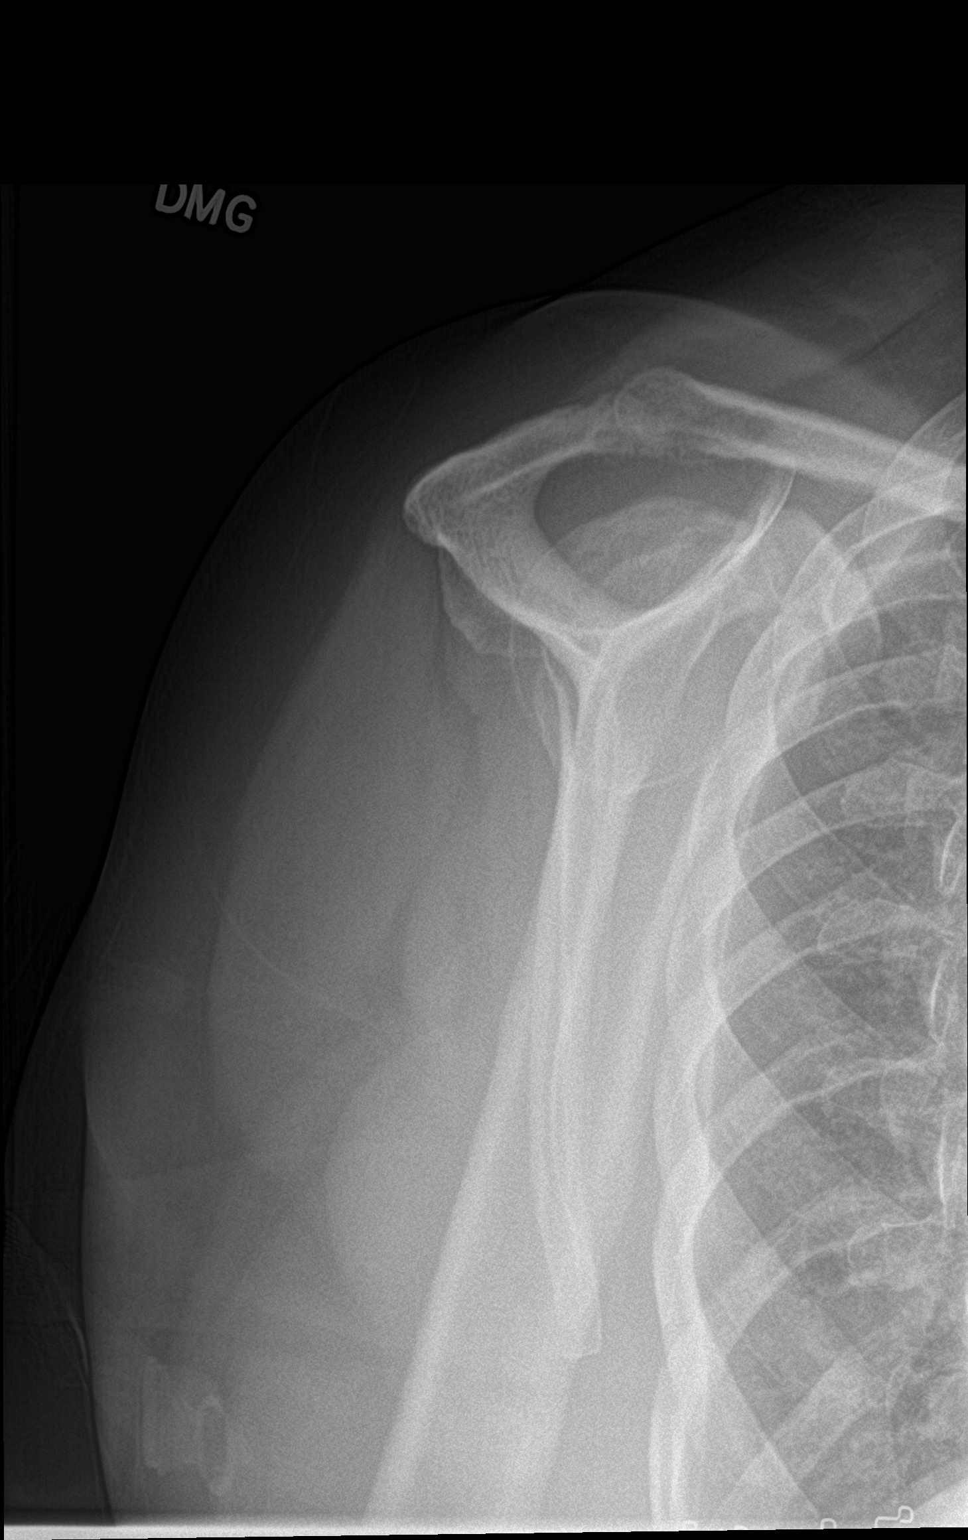

[3 of 3 positions shown; findings below may reference images not displayed]

FINDINGS: There is no evidence of fracture or dislocation. There is no
evidence of arthropathy or other focal bone abnormality. Soft
tissues are unremarkable.
IMPRESSION: Negative.

## 2020-06-27 NOTE — Progress Notes (Signed)
[facility] Note  06/29/2020     CHIEF COMPLAINT Patient presents for Retina Follow Up   HISTORY OF PRESENT ILLNESS: Nancy [name] is a 59 y.o. female who presents to the clinic today for:   HPI    Retina Follow Up    Patient presents with  Retinal Break/Detachment.  In right eye.  Duration of 5 months.  Since onset it is stable.  I, the attending physician,  performed the HPI with the patient and updated documentation appropriately.          Comments    5 month follow up RD OD, lattice degeneration-  Pt was seeing a light right after her laser Tx.  That has improved since last visit.  She does want to make an appt with her eye doctor to update her glasses.  When reading her phone or fine print she has to take her glasses off to see it.  Blink 1-2 x/d OU BS: 158 about an hour ago A1C: 6 something, under 7       Last edited by Bernarda Caffey, MD on 06/29/2020  1:53 PM. (History)    Pt   Referring physician: Redmond School, MD [place],  [place] [id number]  HISTORICAL INFORMATION:   Selected notes from the MEDICAL RECORD NUMBER Referred by Dr. Madelin Headings for concern of retinal holes OD    CURRENT MEDICATIONS: Current Outpatient Medications (Ophthalmic Drugs)  Medication Sig  . neomycin-polymyxin-dexameth (MAXITROL) 0.1 % OINT Place 1 application into the right eye 4 (four) times daily. (Patient not taking: Reported on 08/21/2019)   No current facility-administered medications for this visit. (Ophthalmic Drugs)   Current Outpatient Medications (Other)  Medication Sig  . albuterol (PROVENTIL HFA;VENTOLIN HFA) 108 (90 BASE) MCG/ACT inhaler Inhale 2 puffs into the lungs every 6 (six) hours as needed.  . Blood Glucose Monitoring Suppl (BLOOD GLUCOSE METER) kit Use as instructed, check sugars one time a day at least every other day.  Marland Kitchen FARXIGA 10 MG TABS tablet   . fluticasone (FLONASE) 50 MCG/ACT nasal spray fluticasone  propionate 50 mcg/actuation nasal spray,suspension  . glipiZIDE (GLUCOTROL) 5 MG tablet glipizide 5 mg tablet  . Lancets (ONETOUCH DELICA PLUS [id number]) MISC USE 1 TO CHECK GLUCOSE TWICE DAILY  . lisinopril (PRINIVIL,ZESTRIL) 2.5 MG tablet lisinopril 2.5 mg tablet  . lovastatin (MEVACOR) 10 MG tablet lovastatin 10 mg tablet  . methocarbamol (ROBAXIN) 500 MG tablet Take 1 or 2 po Q 6hrs for muscle pain  . naproxen (NAPROSYN) 250 MG tablet Take 1 po BID with food prn pain  . ONE TOUCH ULTRA TEST test strip USE 1 STRIP TO CHECK GLUCOSE ONCE TO TWICE DAILY   No current facility-administered medications for this visit. (Other)      REVIEW OF SYSTEMS: ROS    Positive for: Endocrine, Eyes, Respiratory, Allergic/Imm   Negative for: Constitutional, Gastrointestinal, Neurological, Skin, Genitourinary, Musculoskeletal, HENT, Cardiovascular, Psychiatric, Heme/Lymph   Last edited by Leonie Douglas, COA on 06/29/2020 12:45 PM. (History)       ALLERGIES Allergies  Allergen Reactions  . Cantaloupe (Diagnostic) Anaphylaxis  . Strawberry Extract Anaphylaxis  . Watermelon Flavor Anaphylaxis  . Metformin And Related Other (See Comments)    Lethargic, swelling of the eyes, drowsiness  . Penicillins Hives    PAST MEDICAL HISTORY Past Medical History:  Diagnosis Date  . Asthma   . Cataract    OU  . Diabetes mellitus without complication ([place])   .  Hypertension   . Hypertensive retinopathy    OU  . Retinal detachment 07/02/2019   OD   Past Surgical History:  Procedure Laterality Date  . BREAST SURGERY    . REPLACEMENT TOTAL KNEE    . RETINAL DETACHMENT SURGERY Right 07/02/2019   Pneumatic retinopexy - Dr. Bernarda Caffey  . right foot      FAMILY HISTORY Family History  Problem Relation Age of Onset  . Anesthesia problems Other        family history   . Diabetes Other        family history   . Glaucoma Sister   . Glaucoma Brother     SOCIAL HISTORY Social History   Tobacco  Use  . Smoking status: Never Smoker  . Smokeless tobacco: Never Used  Substance Use Topics  . Alcohol use: No  . Drug use: No         OPHTHALMIC EXAM:  Base Eye Exam    Visual Acuity (Snellen - Linear)      Right Left   Dist cc 20/30 +2 20/20 -2   Dist ph cc NI NI   Correction: Glasses       Tonometry (Tonopen, 12:52 PM)      Right Left   Pressure 15 13       Pupils      Dark Light Shape React APD   Right 3 2 Round Brisk None   Left 3 2 Round Brisk None       Visual Fields (Counting fingers)      Left Right    Full    Restrictions  Partial outer superior temporal, inferior temporal, superior nasal, inferior nasal deficiencies       Extraocular Movement      Right Left    Full Full       Neuro/Psych    Oriented x3: Yes   Mood/Affect: Normal       Dilation    Both eyes: 1.0% Mydriacyl, 2.5% Phenylephrine @ 12:52 PM        Slit Lamp and Fundus Exam    Slit Lamp Exam      Right Left   Lids/Lashes Dermatochalasis - upper lid, Meibomian gland dysfunction Dermatochalasis - upper lid, mild Meibomian gland dysfunction   Conjunctiva/Sclera mild Melanosis; mild pneumatic chemosis; 1+ injection; mild [place] ST quad mild Melanosis   Cornea mild Arcus, trace Punctate epithelial erosions mild Arcus, trace Punctate epithelial erosions   Anterior Chamber Deep and quiet Deep and quiet   Iris Round and dilated, No NVI Round and dilated, No NVI   Lens 2+ Nuclear sclerosis, 2+ Cortical cataract 1-2+ Nuclear sclerosis, 1-2+ Cortical cataract   Vitreous Vitreous syneresis, PVD, pigment stained vitreous condensations, mild vitreous debris inferiorly Vitreous syneresis, Posterior vitreous detachment       Fundus Exam      Right Left   Disc Sharp rim; +cupping; mild pallor Pink and Sharp, +cupping   C/D Ratio 0.7 0.5   Macula Flat w/ macula reattached, good foveal reflex, SRF improved; mild residual demarcation line inf macula; +RPE mottling Flat, Good foveal reflex, mild  Retinal pigment epithelial mottling, No heme or edema   Vessels mild Vascular attenuation, Tortuous Vascular attenuation, mild Copper wiring, Tortuous   Periphery Retina reattached with stably improved SRF; good cryo changes around 1200; good laser changes from 1000-1230, pre-op: bullous superior temporal RD starting at 0900 to 0100. Horse shoe retinal tear with bridging vessel superiorly (1100); lattice with retinal holes  at 1030, pigmented lattice at 0730 with good laser surrounding Attached, pigmented lattice from [id number] - good laser changes surrounding patches of lattice            IMAGING AND PROCEDURES  Imaging and Procedures for _0 @  OCT, Retina - OU - Both Eyes       Right Eye Quality was good. Central Foveal Thickness: 275. Progression has improved. Findings include subretinal fluid, normal foveal contour, no IRF, outer retinal atrophy (retina stably reattached; tr persistent shallow SRF at demarcation line -- slightly improved).   Left Eye Quality was good. Central Foveal Thickness: 274. Progression has been stable. Findings include normal foveal contour, no SRF, no IRF.   Notes *Images captured and stored on drive  Diagnosis / Impression:  OD: retina stably reattached; tr SRF at demarcation line -- slightly improved, but persistent OS: NFP, no IRF/SRF  Clinical management:  See below  Abbreviations: NFP - Normal foveal profile. CME - cystoid macular edema. PED - pigment epithelial detachment. IRF - intraretinal fluid. SRF - subretinal fluid. EZ - ellipsoid zone. ERM - epiretinal membrane. ORA - outer retinal atrophy. ORT - outer retinal tubulation. SRHM - subretinal hyper-reflective material               ASSESSMENT/PLAN:    ICD-10-CM   1. Right retinal detachment  H33.21   2. 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 Retinal holes, bilateral  H33.323   5. Retinal edema  H35.81 OCT, Retina - OU - Both  Eyes  6. Diabetes mellitus type 2 without retinopathy ([place])  E11.9   7. Essential hypertension  I10   8. Hypertensive retinopathy of both eyes  H35.033   9. Combined forms of age-related cataract of both eyes  H25.813     1,2. Rhegmatogenous retinal detachment, right eye  - s/p laser retinopexy OD for tear/focal RD and lattice (3.4.20)  - 7.23.2020: acute bullous superior mac off detachment, onset of foveal involvement Thursday, 07/02/19 by history  - detached from 0900 to 0100 oclock, fovea off, horseshoe tear at 1100 and patches of lattice w/ retinal holes at 1030  - s/p pneumatic cryopexy OD in office (07.23.20) -- good cryo changes surrounding breaks from 1030-1100  - supplemental fill-in laser (07.29.20) - good laser changes  - retina stably reattached  - tr residual SRF at demarcation -- improving  - BCVA stable at 20/30  - IOP 15 OD   - f/u 6 months  3,4.Lattice degeneration w/ atrophic holes, both eyes  - patches of lattice located at 0730, 0800 and 1000 OD, 0430, 0530-0600 OS  - s/p laser retinopexy OD (03.04.20) -- good laser changes in place  - s/p laser retinopexy OS (03.12.20) -- good laser changes in place  - monitor  5. No retinal edema on exam or OCT  6. Diabetes mellitus, type 2 without retinopathy  - The incidence, risk factors for progression, natural history and treatment options for diabetic retinopathy  were discussed with patient.    - The need for close monitoring of blood glucose, blood pressure, and serum lipids, avoiding cigarette or any type of tobacco, and the need for long term follow up was also discussed with patient.  7,8. Hypertensive retinopathy OU  - discussed importance of tight BP control  - monitor  9. Mild Mixed for age related cataracts OU  - The symptoms of cataract, surgical options, and treatments and risks were  discussed with patient.  - discussed diagnosis and progression  - not yet visually significant  - monitor for  now  Ophthalmic Meds Ordered this visit:  No orders of the defined types were placed in this encounter.      Return in about 6 months (around 12/30/2020) for f/u 6 months RD OD, DFE, OCT.  There are no Patient Instructions on file for this visit.   Explained the diagnoses, plan, and follow up with the patient and they expressed understanding.  Patient expressed understanding of the importance of proper follow up care.    This document serves as a record of services personally performed by Gardiner Sleeper, MD, PhD. It was created on their behalf by Roselee Nova, COMT. The creation of this record is the provider's dictation and/or activities during the visit.  Electronically signed by: Roselee Nova, COMT 06/29/20 1:54 PM  This document serves as a record of services personally performed by Gardiner Sleeper, MD, PhD. It was created on their behalf by San Jetty. Owens Shark, OA an ophthalmic technician. The creation of this record is the provider's dictation and/or activities during the visit.    Electronically signed by: San Jetty. Owens Shark, [place] 07.21.2021 1:54 PM   Gardiner Sleeper, M.D., Ph.D. Diseases & Surgery of the Retina and Vitreous [facility]  I have reviewed the above documentation for accuracy and completeness, and I agree with the above. Gardiner Sleeper, M.D., Ph.D. 06/29/20 1:55 PM   Abbreviations: M myopia (nearsighted); A astigmatism; H hyperopia (farsighted); P presbyopia; Mrx spectacle prescription;  CTL contact lenses; OD right eye; OS left eye; OU both eyes  XT exotropia; ET esotropia; PEK punctate epithelial keratitis; PEE punctate epithelial erosions; DES dry eye syndrome; MGD meibomian gland dysfunction; ATs artificial tears; PFAT's preservative free artificial tears; [place] nuclear sclerotic cataract; PSC posterior subcapsular cataract; ERM epi-retinal membrane; PVD posterior vitreous detachment; RD retinal detachment; DM diabetes mellitus; DR diabetic  retinopathy; NPDR non-proliferative diabetic retinopathy; PDR proliferative diabetic retinopathy; CSME clinically significant macular edema; DME diabetic macular edema; dbh dot blot hemorrhages; CWS cotton wool spot; POAG primary open angle glaucoma; C/D cup-to-disc ratio; HVF humphrey visual field; GVF goldmann visual field; OCT optical coherence tomography; IOP intraocular pressure; BRVO Branch retinal vein occlusion; CRVO central retinal vein occlusion; CRAO central retinal artery occlusion; BRAO branch retinal artery occlusion; RT retinal tear; SB scleral buckle; PPV pars plana vitrectomy; VH Vitreous hemorrhage; PRP panretinal laser photocoagulation; IVK intravitreal kenalog; VMT vitreomacular traction; MH Macular hole;  NVD neovascularization of the disc; NVE neovascularization elsewhere; AREDS age related eye disease study; ARMD age related macular degeneration; POAG primary open angle glaucoma; EBMD epithelial/anterior basement membrane dystrophy; ACIOL anterior chamber intraocular lens; IOL intraocular lens; PCIOL posterior chamber intraocular lens; Phaco/IOL phacoemulsification with intraocular lens placement; [place] photorefractive keratectomy; LASIK laser assisted in situ keratomileusis; HTN hypertension; DM diabetes mellitus; COPD chronic obstructive pulmonary disease

## 2020-06-29 ENCOUNTER — Ambulatory Visit (INDEPENDENT_AMBULATORY_CARE_PROVIDER_SITE_OTHER): Payer: BC Managed Care – PPO | Admitting: Ophthalmology

## 2020-06-29 ENCOUNTER — Encounter (INDEPENDENT_AMBULATORY_CARE_PROVIDER_SITE_OTHER): Payer: Self-pay | Admitting: Ophthalmology

## 2020-06-29 ENCOUNTER — Other Ambulatory Visit: Payer: Self-pay

## 2020-06-29 DIAGNOSIS — H33323 Round hole, bilateral: Secondary | ICD-10-CM | POA: Diagnosis not present

## 2020-06-29 DIAGNOSIS — I10 Essential (primary) hypertension: Secondary | ICD-10-CM

## 2020-06-29 DIAGNOSIS — H35033 Hypertensive retinopathy, bilateral: Secondary | ICD-10-CM

## 2020-06-29 DIAGNOSIS — H33311 Horseshoe tear of retina without detachment, right eye: Secondary | ICD-10-CM

## 2020-06-29 DIAGNOSIS — E119 Type 2 diabetes mellitus without complications: Secondary | ICD-10-CM

## 2020-06-29 DIAGNOSIS — H3321 Serous retinal detachment, right eye: Secondary | ICD-10-CM | POA: Diagnosis not present

## 2020-06-29 DIAGNOSIS — H35413 Lattice degeneration of retina, bilateral: Secondary | ICD-10-CM | POA: Diagnosis not present

## 2020-06-29 DIAGNOSIS — H3581 Retinal edema: Secondary | ICD-10-CM

## 2020-06-29 DIAGNOSIS — H25813 Combined forms of age-related cataract, bilateral: Secondary | ICD-10-CM

## 2021-01-02 NOTE — Progress Notes (Signed)
[facility] Note  01/04/2021     CHIEF COMPLAINT Patient presents for Retina Follow Up   HISTORY OF PRESENT ILLNESS: Nancy [name] is a 60 y.o. female who presents to the clinic today for:   HPI    Retina Follow Up    Patient presents with  Retinal Break/Detachment.  In right eye.  Duration of 6 months.  I, the attending physician,  performed the HPI with the patient and updated documentation appropriately.          Comments    Patient states vision the same OU. BS was 130 this am. Last a1c was around 6.1, checked 6 months ago. Patient still sees "kaleidoscope" in corner of vision OD, but this symptom is improving, as patient sees this less often.        Last edited by Bernarda Caffey, MD on 01/04/2021  1:50 PM. (History)    Pt states she is still seeing a "kaleidoscope" in her temporal vision, she states it is "vanishing"  Referring physician: Madelin Headings, DO [address] [place],  [place] [address]  HISTORICAL INFORMATION:   Selected notes from the MEDICAL RECORD NUMBER Referred by Dr. Madelin Headings for concern of retinal holes OD    CURRENT MEDICATIONS: Current Outpatient Medications (Ophthalmic Drugs)  Medication Sig  . neomycin-polymyxin-dexameth (MAXITROL) 0.1 % OINT Place 1 application into the right eye 4 (four) times daily. (Patient not taking: No sig reported)   No current facility-administered medications for this visit. (Ophthalmic Drugs)   Current Outpatient Medications (Other)  Medication Sig  . albuterol (PROVENTIL HFA;VENTOLIN HFA) 108 (90 BASE) MCG/ACT inhaler Inhale 2 puffs into the lungs every 6 (six) hours as needed.  . Blood Glucose Monitoring Suppl (BLOOD GLUCOSE METER) kit Use as instructed, check sugars one time a day at least every other day.  Marland Kitchen FARXIGA 10 MG TABS tablet   . fluticasone (FLONASE) 50 MCG/ACT nasal spray fluticasone propionate 50 mcg/actuation nasal spray,suspension  . Lancets (ONETOUCH DELICA PLUS  [id number]) MISC USE 1 TO CHECK GLUCOSE TWICE DAILY  . lisinopril (PRINIVIL,ZESTRIL) 2.5 MG tablet lisinopril 2.5 mg tablet  . lovastatin (MEVACOR) 10 MG tablet lovastatin 10 mg tablet  . methocarbamol (ROBAXIN) 500 MG tablet Take 1 or 2 po Q 6hrs for muscle pain  . naproxen (NAPROSYN) 250 MG tablet Take 1 po BID with food prn pain  . ONE TOUCH ULTRA TEST test strip USE 1 STRIP TO CHECK GLUCOSE ONCE TO TWICE DAILY  . glipiZIDE (GLUCOTROL) 5 MG tablet glipizide 5 mg tablet   No current facility-administered medications for this visit. (Other)      REVIEW OF SYSTEMS: ROS    Positive for: Endocrine, Eyes, Respiratory, Allergic/Imm   Negative for: Constitutional, Gastrointestinal, Neurological, Skin, Genitourinary, Musculoskeletal, HENT, Cardiovascular, Psychiatric, Heme/Lymph   Last edited by Roselee Nova D, COT on 01/04/2021 10:44 AM. (History)       ALLERGIES Allergies  Allergen Reactions  . Cantaloupe (Diagnostic) Anaphylaxis  . Strawberry Extract Anaphylaxis  . Watermelon Flavor Anaphylaxis  . Metformin And Related Other (See Comments)    Lethargic, swelling of the eyes, drowsiness  . Penicillins Hives    PAST MEDICAL HISTORY Past Medical History:  Diagnosis Date  . Asthma   . Cataract    OU  . Diabetes mellitus without complication ([place])   . Hypertension   . Hypertensive retinopathy    OU  . Retinal detachment 07/02/2019   OD   Past Surgical History:  Procedure Laterality Date  . BREAST SURGERY    . REPLACEMENT TOTAL KNEE    . RETINAL DETACHMENT SURGERY Right 07/02/2019   Pneumatic retinopexy - Dr. Bernarda Caffey  . right foot      FAMILY HISTORY Family History  Problem Relation Age of Onset  . Anesthesia problems Other        family history   . Diabetes Other        family history   . Glaucoma Sister   . Glaucoma Brother     SOCIAL HISTORY Social History   Tobacco Use  . Smoking status: Never Smoker  . Smokeless tobacco: Never Used  Substance  Use Topics  . Alcohol use: No  . Drug use: No         OPHTHALMIC EXAM:  Base Eye Exam    Visual Acuity (Snellen - Linear)      Right Left   Dist cc 20/25 -2 20/20 -1   Dist ph cc NI    Correction: Glasses       Tonometry (Tonopen, 10:55 AM)      Right Left   Pressure 19 15       Pupils      Dark Light Shape React APD   Right 3 2 Round Brisk None   Left 3 2 Round Brisk None       Visual Fields (Counting fingers)      Left Right    Full    Restrictions  Partial outer superior temporal, inferior temporal, superior nasal, inferior nasal deficiencies       Extraocular Movement      Right Left    Full, Ortho Full, Ortho       Neuro/Psych    Oriented x3: Yes   Mood/Affect: Normal       Dilation    Both eyes: 1.0% Mydriacyl, 2.5% Phenylephrine @ 10:55 AM        Slit Lamp and Fundus Exam    Slit Lamp Exam      Right Left   Lids/Lashes Dermatochalasis - upper lid, Meibomian gland dysfunction Dermatochalasis - upper lid, mild Meibomian gland dysfunction   Conjunctiva/Sclera mild Melanosis; mild pneumatic chemosis; 1+ injection; mild [place]ola ST quad mild Melanosis   Cornea mild Arcus, 1+Punctate epithelial erosions mild Arcus, 1+ inferior Punctate epithelial erosions   Anterior Chamber Deep and quiet Deep and quiet   Iris Round and dilated, No NVI Round and dilated, No NVI   Lens 2+ Nuclear sclerosis, 2+ Cortical cataract 2+ Nuclear sclerosis, 2+ Cortical cataract   Vitreous Vitreous syneresis, PVD, pigment stained vitreous condensations, mild vitreous debris inferiorly Vitreous syneresis, Posterior vitreous detachment       Fundus Exam      Right Left   Disc Sharp rim; +cupping; mild pallor, Thin inferior rim Pink and Sharp, +cupping   C/D Ratio 0.7 0.5   Macula Flat, good foveal reflex, SRF improved -- almost resolved; mild residual demarcation line inf macula; +RPE mottling Flat, Good foveal reflex, mild Retinal pigment epithelial mottling, No heme or edema    Vessels mild Vascular attenuation, Tortuous attenuated, mild tortuousity   Periphery Retina reattached with stably improved SRF; good cryo changes around 1200; good laser changes from 1000-1230, pre-op: bullous superior temporal RD starting at 0900 to 0100. Horse shoe retinal tear with bridging vessel superiorly (1100); lattice with retinal holes at 1030, pigmented lattice at 0730 with good laser surrounding, No new RT/RD Attached, pigmented lattice from 3212-2482 - good laser changes  surrounding patches of lattice, No new RT/RD        Refraction    Wearing Rx      Sphere Cylinder Axis   Right -6.25 +1.50 093   Left -5.25 +1.25 090   Type: SVL          IMAGING AND PROCEDURES  Imaging and Procedures for _0 @  OCT, Retina - OU - Both Eyes       Right Eye Quality was good. Central Foveal Thickness: 272. Progression has improved. Findings include subretinal fluid, normal foveal contour, no IRF, outer retinal atrophy (retina stably reattached; shallow SRF at demarcation line -- almost completely resolved).   Left Eye Quality was good. Central Foveal Thickness: 275. Progression has been stable. Findings include normal foveal contour, no SRF, no IRF.   Notes *Images captured and stored on drive  Diagnosis / Impression:  OD: retina stably reattached; shallow SRF at demarcation line -- almost completely resolved OS: NFP, no IRF/SRF  Clinical management:  See below  Abbreviations: NFP - Normal foveal profile. CME - cystoid macular edema. PED - pigment epithelial detachment. IRF - intraretinal fluid. SRF - subretinal fluid. EZ - ellipsoid zone. ERM - epiretinal membrane. ORA - outer retinal atrophy. ORT - outer retinal tubulation. SRHM - subretinal hyper-reflective material               ASSESSMENT/PLAN:    ICD-10-CM   1. Right retinal detachment  H33.21   2. 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 Retinal holes,  bilateral  H33.323   5. Retinal edema  H35.81 OCT, Retina - OU - Both Eyes  6. Diabetes mellitus type 2 without retinopathy ([place])  E11.9   7. Essential hypertension  I10   8. Hypertensive retinopathy of both eyes  H35.033   9. Combined forms of age-related cataract of both eyes  H25.813     1,2. Rhegmatogenous retinal detachment, right eye  - s/p laser retinopexy OD for tear/focal RD and lattice (3.4.20)  - 7.23.2020: acute bullous superior mac off detachment, onset of foveal involvement Thursday, 07/02/19 by history  - detached from 0900 to 0100 oclock, fovea off, horseshoe tear at 1100 and patches of lattice w/ retinal holes at 1030  - s/p pneumatic cryopexy OD in office (07.23.20) -- good cryo changes surrounding breaks from 1030-1100  - supplemental fill-in laser (07.29.20) - good laser changes  - retina stably reattached  - tr residual SRF at demarcation -- almost completely resolved  - BCVA improved to 20/25  - IOP 19 OD   - f/u 9 months  3,4.Lattice degeneration w/ atrophic holes, both eyes  - patches of lattice located at 0730, 0800 and 1000 OD, 0430, 0530-0600 OS  - s/p laser retinopexy OD (03.04.20) -- good laser changes in place  - s/p laser retinopexy OS (03.12.20) -- good laser changes in place  - monitor  5. No retinal edema on exam or OCT  6. Diabetes mellitus, type 2 without retinopathy  - The incidence, risk factors for progression, natural history and treatment options for diabetic retinopathy  were discussed with patient.    - The need for close monitoring of blood glucose, blood pressure, and serum lipids, avoiding cigarette and any type of tobacco, and the need for long term follow up care was also discussed with patient  7,8. Hypertensive retinopathy OU  - discussed importance of tight BP control  - monitor  9.  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  -  discussed diagnosis and progression  - not yet visually significant  - monitor for now  Ophthalmic Meds Ordered this visit:  No orders of the defined types were placed in this encounter.      Return in about 9 months (around 10/04/2021) for f/u RD OD, DFE, OCT.  There are no Patient Instructions on file for this visit.   Explained the diagnoses, plan, and follow up with the patient and they expressed understanding.  Patient expressed understanding of the importance of proper follow up care.    This document serves as a record of services personally performed by Gardiner Sleeper, MD, PhD. It was created on their behalf by Roselee Nova, COMT. The creation of this record is the provider's dictation and/or activities during the visit.  Electronically signed by: Roselee Nova, COMT 01/04/21 1:55 PM   This document serves as a record of services personally performed by Gardiner Sleeper, MD, PhD. It was created on their behalf by San Jetty. Owens Shark, OA an ophthalmic technician. The creation of this record is the provider's dictation and/or activities during the visit.    Electronically signed by: San Jetty. Owens Shark, [place] 01.26.2021 1:55 PM  Gardiner Sleeper, M.D., Ph.D. Diseases & Surgery of the Retina and Vitreous [facility]  I have reviewed the above documentation for accuracy and completeness, and I agree with the above. Gardiner Sleeper, M.D., Ph.D. 01/04/21 1:55 PM    Abbreviations: M myopia (nearsighted); A astigmatism; H hyperopia (farsighted); P presbyopia; Mrx spectacle prescription;  CTL contact lenses; OD right eye; OS left eye; OU both eyes  XT exotropia; ET esotropia; PEK punctate epithelial keratitis; PEE punctate epithelial erosions; DES dry eye syndrome; MGD meibomian gland dysfunction; ATs artificial tears; PFAT's preservative free artificial tears; [place] nuclear sclerotic cataract; PSC posterior subcapsular cataract; ERM epi-retinal membrane; PVD posterior  vitreous detachment; RD retinal detachment; DM diabetes mellitus; DR diabetic retinopathy; NPDR non-proliferative diabetic retinopathy; PDR proliferative diabetic retinopathy; CSME clinically significant macular edema; DME diabetic macular edema; dbh dot blot hemorrhages; CWS cotton wool spot; POAG primary open angle glaucoma; C/D cup-to-disc ratio; HVF humphrey visual field; GVF goldmann visual field; OCT optical coherence tomography; IOP intraocular pressure; BRVO Branch retinal vein occlusion; CRVO central retinal vein occlusion; CRAO central retinal artery occlusion; BRAO branch retinal artery occlusion; RT retinal tear; SB scleral buckle; PPV pars plana vitrectomy; VH Vitreous hemorrhage; PRP panretinal laser photocoagulation; IVK intravitreal kenalog; VMT vitreomacular traction; MH Macular hole;  NVD neovascularization of the disc; NVE neovascularization elsewhere; AREDS age related eye disease study; ARMD age related macular degeneration; POAG primary open angle glaucoma; EBMD epithelial/anterior basement membrane dystrophy; ACIOL anterior chamber intraocular lens; IOL intraocular lens; PCIOL posterior chamber intraocular lens; Phaco/IOL phacoemulsification with intraocular lens placement; [place] photorefractive keratectomy; LASIK laser assisted in situ keratomileusis; HTN hypertension; DM diabetes mellitus; COPD chronic obstructive pulmonary disease

## 2021-01-04 ENCOUNTER — Ambulatory Visit (INDEPENDENT_AMBULATORY_CARE_PROVIDER_SITE_OTHER): Payer: BC Managed Care – PPO | Admitting: Ophthalmology

## 2021-01-04 ENCOUNTER — Encounter (INDEPENDENT_AMBULATORY_CARE_PROVIDER_SITE_OTHER): Payer: Self-pay | Admitting: Ophthalmology

## 2021-01-04 ENCOUNTER — Other Ambulatory Visit: Payer: Self-pay

## 2021-01-04 DIAGNOSIS — H33323 Round hole, bilateral: Secondary | ICD-10-CM | POA: Diagnosis not present

## 2021-01-04 DIAGNOSIS — H25813 Combined forms of age-related cataract, bilateral: Secondary | ICD-10-CM

## 2021-01-04 DIAGNOSIS — I10 Essential (primary) hypertension: Secondary | ICD-10-CM

## 2021-01-04 DIAGNOSIS — H33311 Horseshoe tear of retina without detachment, right eye: Secondary | ICD-10-CM

## 2021-01-04 DIAGNOSIS — H3321 Serous retinal detachment, right eye: Secondary | ICD-10-CM | POA: Diagnosis not present

## 2021-01-04 DIAGNOSIS — H35033 Hypertensive retinopathy, bilateral: Secondary | ICD-10-CM

## 2021-01-04 DIAGNOSIS — H35413 Lattice degeneration of retina, bilateral: Secondary | ICD-10-CM | POA: Diagnosis not present

## 2021-01-04 DIAGNOSIS — H3581 Retinal edema: Secondary | ICD-10-CM

## 2021-01-04 DIAGNOSIS — E119 Type 2 diabetes mellitus without complications: Secondary | ICD-10-CM

## 2021-10-04 ENCOUNTER — Encounter (INDEPENDENT_AMBULATORY_CARE_PROVIDER_SITE_OTHER): Payer: BC Managed Care – PPO | Admitting: Ophthalmology

## 2021-10-09 NOTE — Progress Notes (Shared)
[facility] Note  10/11/2021     CHIEF COMPLAINT Patient presents for No chief complaint on file.   HISTORY OF PRESENT ILLNESS: Nancy [name] is a 60 y.o. female who presents to the clinic today for:     Referring physician: [facility], [facility],  [place] [address]  HISTORICAL INFORMATION:   Selected notes from the MEDICAL RECORD NUMBER Referred by Dr. Madelin Headings for concern of retinal holes OD    CURRENT MEDICATIONS: Current Outpatient Medications (Ophthalmic Drugs)  Medication Sig   neomycin-polymyxin-dexameth (MAXITROL) 0.1 % OINT Place 1 application into the right eye 4 (four) times daily. (Patient not taking: No sig reported)   No current facility-administered medications for this visit. (Ophthalmic Drugs)   Current Outpatient Medications (Other)  Medication Sig   albuterol (PROVENTIL HFA;VENTOLIN HFA) 108 (90 BASE) MCG/ACT inhaler Inhale 2 puffs into the lungs every 6 (six) hours as needed.   Blood Glucose Monitoring Suppl (BLOOD GLUCOSE METER) kit Use as instructed, check sugars one time a day at least every other day.   FARXIGA 10 MG TABS tablet    fluticasone (FLONASE) 50 MCG/ACT nasal spray fluticasone propionate 50 mcg/actuation nasal spray,suspension   glipiZIDE (GLUCOTROL) 5 MG tablet glipizide 5 mg tablet   Lancets (ONETOUCH DELICA PLUS [id number]) MISC USE 1 TO CHECK GLUCOSE TWICE DAILY   lisinopril (PRINIVIL,ZESTRIL) 2.5 MG tablet lisinopril 2.5 mg tablet   lovastatin (MEVACOR) 10 MG tablet lovastatin 10 mg tablet   methocarbamol (ROBAXIN) 500 MG tablet Take 1 or 2 po Q 6hrs for muscle pain   naproxen (NAPROSYN) 250 MG tablet Take 1 po BID with food prn pain   ONE TOUCH ULTRA TEST test strip USE 1 STRIP TO CHECK GLUCOSE ONCE TO TWICE DAILY   No current facility-administered medications for this visit. (Other)      REVIEW OF SYSTEMS:     ALLERGIES Allergies  Allergen Reactions   Cantaloupe  (Diagnostic) Anaphylaxis   Strawberry Extract Anaphylaxis   Watermelon Flavor Anaphylaxis   Metformin And Related Other (See Comments)    Lethargic, swelling of the eyes, drowsiness   Penicillins Hives    PAST MEDICAL HISTORY Past Medical History:  Diagnosis Date   Asthma    Cataract    OU   Diabetes mellitus without complication ([place])    Hypertension    Hypertensive retinopathy    OU   Retinal detachment 07/02/2019   OD   Past Surgical History:  Procedure Laterality Date   BREAST SURGERY     REPLACEMENT TOTAL KNEE     RETINAL DETACHMENT SURGERY Right 07/02/2019   Pneumatic retinopexy - Dr. Bernarda Caffey   right foot      FAMILY HISTORY Family History  Problem Relation Age of Onset   Anesthesia problems Other        family history    Diabetes Other        family history    Glaucoma Sister    Glaucoma Brother     SOCIAL HISTORY Social History   Tobacco Use   Smoking status: Never   Smokeless tobacco: Never  Substance Use Topics   Alcohol use: No   Drug use: No         OPHTHALMIC EXAM:  Not recorded     IMAGING AND PROCEDURES  Imaging and Procedures for _0 @          ASSESSMENT/PLAN:    ICD-10-CM   1. Right retinal detachment  H33.21     2. Retinal edema  H35.81     3. Retinal tear of right eye  H33.311     4. Lattice degeneration of both retinas  H35.413     5. Retinal holes, bilateral  H33.323     6. Diabetes mellitus type 2 without retinopathy ([place])  E11.9     7. Essential hypertension  I10     8. Hypertensive retinopathy of both eyes  H35.033        1,2. Rhegmatogenous retinal detachment, right eye  - s/p laser retinopexy OD for tear/focal RD and lattice (3.4.20)  - 7.23.2020: acute bullous superior mac off detachment, onset of foveal involvement Thursday, 07/02/19 by history  - detached from 0900 to 0100 oclock, fovea off, horseshoe tear at 1100 and patches of lattice w/ retinal holes at 1030  - s/p pneumatic cryopexy  OD in office (07.23.20) -- good cryo changes surrounding breaks from 1030-1100  - supplemental fill-in laser (07.29.20) - good laser changes  - retina stably reattached  - tr residual SRF at demarcation -- almost completely resolved  - BCVA improved to 20/25  - IOP 19 OD   - f/u 9 months  3,4.Lattice degeneration w/ atrophic holes, both eyes  - patches of lattice located at 0730, 0800 and 1000 OD, 0430, 0530-0600 OS  - s/p laser retinopexy OD (03.04.20) -- good laser changes in place  - s/p laser retinopexy OS (03.12.20) -- good laser changes in place  - monitor  5. No retinal edema on exam or OCT  6. Diabetes mellitus, type 2 without retinopathy  - The incidence, risk factors for progression, natural history and treatment options for diabetic retinopathy  were discussed with patient.    - The need for close monitoring of blood glucose, blood pressure, and serum lipids, avoiding cigarette and any type of tobacco, and the need for long term follow up care was also discussed with patient  7,8. Hypertensive retinopathy OU  - discussed importance of tight BP control  - monitor  9. 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  - discussed diagnosis and progression  - not yet visually significant  - monitor for now  Ophthalmic Meds Ordered this visit:  No orders of the defined types were placed in this encounter.      No follow-ups on file.  There are no Patient Instructions on file for this visit.   Explained the diagnoses, plan, and follow up with the patient and they expressed understanding.  Patient expressed understanding of the importance of proper follow up care.    This document serves as a record of services personally performed by Gardiner Sleeper, MD, PhD. It was created on their behalf by Orvan Falconer, an ophthalmic technician. The creation of this record is the  provider's dictation and/or activities during the visit.    Electronically signed by: Orvan Falconer, OA, 10/09/21  10:44 AM   Gardiner Sleeper, M.D., Ph.D. Diseases & Surgery of the Retina and Vitreous [facility]  I have reviewed the above documentation for accuracy and completeness, and I agree with the above. Gardiner Sleeper, M.D., Ph.D. 01/04/21 10:44 AM    Abbreviations: M myopia (nearsighted); A astigmatism; H hyperopia (farsighted); P presbyopia; Mrx spectacle prescription;  CTL contact lenses; OD right eye; OS left eye; OU both eyes  XT exotropia; ET esotropia; PEK punctate epithelial  keratitis; PEE punctate epithelial erosions; DES dry eye syndrome; MGD meibomian gland dysfunction; ATs artificial tears; PFAT's preservative free artificial tears; [place] nuclear sclerotic cataract; PSC posterior subcapsular cataract; ERM epi-retinal membrane; PVD posterior vitreous detachment; RD retinal detachment; DM diabetes mellitus; DR diabetic retinopathy; NPDR non-proliferative diabetic retinopathy; PDR proliferative diabetic retinopathy; CSME clinically significant macular edema; DME diabetic macular edema; dbh dot blot hemorrhages; CWS cotton wool spot; POAG primary open angle glaucoma; C/D cup-to-disc ratio; HVF humphrey visual field; GVF goldmann visual field; OCT optical coherence tomography; IOP intraocular pressure; BRVO Branch retinal vein occlusion; CRVO central retinal vein occlusion; CRAO central retinal artery occlusion; BRAO branch retinal artery occlusion; RT retinal tear; SB scleral buckle; PPV pars plana vitrectomy; VH Vitreous hemorrhage; PRP panretinal laser photocoagulation; IVK intravitreal kenalog; VMT vitreomacular traction; MH Macular hole;  NVD neovascularization of the disc; NVE neovascularization elsewhere; AREDS age related eye disease study; ARMD age related macular degeneration; POAG primary open angle glaucoma; EBMD epithelial/anterior basement membrane  dystrophy; ACIOL anterior chamber intraocular lens; IOL intraocular lens; PCIOL posterior chamber intraocular lens; Phaco/IOL phacoemulsification with intraocular lens placement; [place] photorefractive keratectomy; LASIK laser assisted in situ keratomileusis; HTN hypertension; DM diabetes mellitus; COPD chronic obstructive pulmonary disease

## 2021-10-11 ENCOUNTER — Encounter (INDEPENDENT_AMBULATORY_CARE_PROVIDER_SITE_OTHER): Payer: BC Managed Care – PPO | Admitting: Ophthalmology

## 2021-10-19 NOTE — Progress Notes (Addendum)
[facility] Note  10/25/2021    CHIEF COMPLAINT Patient presents for Retina Follow Up   HISTORY OF PRESENT ILLNESS: Nancy [name] is a 60 y.o. female who presents to the clinic today for:   HPI     Retina Follow Up   Patient presents with  Retinal Break/Detachment.  In right eye.  Severity is moderate.  Duration of 10 months.  Since onset it is stable.  I, the attending physician,  performed the HPI with the patient and updated documentation appropriately.        Comments   Pt here for 10 mo f/u for RD OD. Pt states no vision changes. She did get new rx specs since previous visit. Unsure of most recent A1C but most recent blood sugar level was in the 90s. No ocular pain or discomfort. No new floaters or FOL other than the kaleidescope floater in R corner of OD.       Last edited by Bernarda Caffey, MD on 10/25/2021 11:49 AM.    Pt states she has gotten new glasses, she states every once in awhile, she still sees a "kaleidescope" floater in the corner of her right eye   Referring physician: Madelin Headings, DO [address] [place],   [id number]  HISTORICAL INFORMATION:  Selected notes from the MEDICAL RECORD NUMBER Referred by Dr. Madelin Headings for concern of retinal holes OD    CURRENT MEDICATIONS: Current Outpatient Medications (Ophthalmic Drugs)  Medication Sig   neomycin-polymyxin-dexameth (MAXITROL) 0.1 % OINT Place 1 application into the right eye 4 (four) times daily. (Patient not taking: No sig reported)   No current facility-administered medications for this visit. (Ophthalmic Drugs)   Current Outpatient Medications (Other)  Medication Sig   albuterol (PROVENTIL HFA;VENTOLIN HFA) 108 (90 BASE) MCG/ACT inhaler Inhale 2 puffs into the lungs every 6 (six) hours as needed.   FARXIGA 10 MG TABS tablet    fluticasone (FLONASE) 50 MCG/ACT nasal spray fluticasone propionate 50 mcg/actuation nasal spray,suspension   Blood Glucose  Monitoring Suppl (BLOOD GLUCOSE METER) kit Use as instructed, check sugars one time a day at least every other day.   glipiZIDE (GLUCOTROL) 5 MG tablet glipizide 5 mg tablet   Lancets (ONETOUCH DELICA PLUS [id number]) MISC USE 1 TO CHECK GLUCOSE TWICE DAILY (Patient not taking: Reported on 10/25/2021)   lisinopril (PRINIVIL,ZESTRIL) 2.5 MG tablet lisinopril 2.5 mg tablet (Patient not taking: Reported on 10/25/2021)   lovastatin (MEVACOR) 10 MG tablet lovastatin 10 mg tablet (Patient not taking: Reported on 10/25/2021)   methocarbamol (ROBAXIN) 500 MG tablet Take 1 or 2 po Q 6hrs for muscle pain (Patient not taking: Reported on 10/25/2021)   naproxen (NAPROSYN) 250 MG tablet Take 1 po BID with food prn pain (Patient not taking: Reported on 10/25/2021)   ONE TOUCH ULTRA TEST test strip USE 1 STRIP TO CHECK GLUCOSE ONCE TO TWICE DAILY (Patient not taking: Reported on 10/25/2021)   No current facility-administered medications for this visit. (Other)   REVIEW OF SYSTEMS: ROS   Positive for: Endocrine, Eyes, Respiratory, Allergic/Imm Negative for: Constitutional, Gastrointestinal, Neurological, Skin, Genitourinary, Musculoskeletal, HENT, Cardiovascular, Psychiatric, Heme/Lymph Last edited by Kingsley Spittle, COT on 10/25/2021  9:27 AM.     ALLERGIES Allergies  Allergen Reactions   Cantaloupe (Diagnostic) Anaphylaxis   Strawberry Extract Anaphylaxis   Watermelon Flavor Anaphylaxis   Metformin And Related Other (See Comments)    Lethargic, swelling of the eyes, drowsiness   Penicillins Hives  PAST MEDICAL HISTORY Past Medical History:  Diagnosis Date   Asthma    Cataract    OU   Diabetes mellitus without complication ([place])    Hypertension    Hypertensive retinopathy    OU   Retinal detachment 07/02/2019   OD   Past Surgical History:  Procedure Laterality Date   BREAST SURGERY     REPLACEMENT TOTAL KNEE     RETINAL DETACHMENT SURGERY Right 07/02/2019   Pneumatic retinopexy -  Dr. Bernarda Caffey   right foot     FAMILY HISTORY Family History  Problem Relation Age of Onset   Anesthesia problems Other        family history    Diabetes Other        family history    Glaucoma Sister    Glaucoma Brother    SOCIAL HISTORY Social History   Tobacco Use   Smoking status: Never   Smokeless tobacco: Never  Substance Use Topics   Alcohol use: No   Drug use: No       OPHTHALMIC EXAM: Base Eye Exam     Visual Acuity (Snellen - Linear)       Right Left   Dist cc 20/40 20/20   Dist ph cc 20/40 +2     Correction: Glasses         Tonometry (Tonopen, 9:36 AM)       Right Left   Pressure 17 16         Pupils       Dark Light Shape React APD   Right 3 2 Round Brisk None   Left 3 2 Round Brisk None         Visual Fields (Counting fingers)       Left Right    Full    Restrictions  Partial outer superior temporal, superior nasal deficiencies         Extraocular Movement       Right Left    Full, Ortho Full, Ortho         Neuro/Psych     Oriented x3: Yes   Mood/Affect: Normal         Dilation     Both eyes: 1.0% Mydriacyl, 2.5% Phenylephrine @ 9:37 AM           Slit Lamp and Fundus Exam     Slit Lamp Exam       Right Left   Lids/Lashes Mild Dermatochalasis - upper lid Dermatochalasis - upper lid, mild Meibomian gland dysfunction   Conjunctiva/Sclera mild Melanosis mild Melanosis   Cornea mild Arcus, trace Punctate epithelial erosions mild Arcus, 1+ inferior Punctate epithelial erosions   Anterior Chamber Deep and quiet Deep and quiet   Iris Round and dilated, No NVI Round and dilated, No NVI   Lens 2+ Nuclear sclerosis, 2+ Cortical cataract 2+ Nuclear sclerosis, 2+ Cortical cataract   Vitreous Vitreous syneresis, PVD, vitreous condensations inferiorly Vitreous syneresis, Posterior vitreous detachment         Fundus Exam       Right Left   Disc Sharp rim; +cupping; mild pallor, Thin inferior rim Pink and  Sharp, +cupping   C/D Ratio 0.7 0.5   Macula Flat, good foveal reflex, trace ERM, rare MA Flat, Good foveal reflex, mild Retinal pigment epithelial mottling, No heme or edema   Vessels attenuated, Tortuous, mild Copper wiring, mild AV crossing changes attenuated, mild tortuousity   Periphery Retina reattached with stably improved SRF; good cryo changes  around 1200; good laser changes from 1000-1230, pre-op: bullous superior temporal RD starting at 0900 to 0100. Horse shoe retinal tear with bridging vessel superiorly (1100); lattice with retinal holes at 1030, pigmented lattice at 0730 with good laser surrounding, No new RT/RD/lattice Attached, pigmented lattice from 4982-6415 - good laser changes surrounding patches of lattice, No new RT/RD/lattice           IMAGING AND PROCEDURES  Imaging and Procedures for _0 @  OCT, Retina - OU - Both Eyes       Right Eye Quality was good. Central Foveal Thickness: 278. Progression has been stable. Findings include normal foveal contour, no IRF, outer retinal atrophy, no SRF (retina stably reattached; SRF completely resolved).   Left Eye Quality was good. Central Foveal Thickness: 280. Progression has been stable. Findings include normal foveal contour, no SRF, no IRF.   Notes *Images captured and stored on drive  Diagnosis / Impression:  OD: retina stably reattached; SRF completely resolved OS: NFP, no IRF/SRF  Clinical management:  See below  Abbreviations: NFP - Normal foveal profile. CME - cystoid macular edema. PED - pigment epithelial detachment. IRF - intraretinal fluid. SRF - subretinal fluid. EZ - ellipsoid zone. ERM - epiretinal membrane. ORA - outer retinal atrophy. ORT - outer retinal tubulation. SRHM - subretinal hyper-reflective material             ASSESSMENT/PLAN:    ICD-10-CM   1. Right retinal detachment  H33.21     2. Retinal edema  H35.81 OCT, Retina - OU - Both Eyes    3. Retinal tear of right eye  H33.311      4. Lattice degeneration of both retinas  H35.413     5. Retinal holes, bilateral  H33.323     6. 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 Essential hypertension  I10     8. Hypertensive retinopathy of both eyes  H35.033     9. Combined forms of age-related cataract of both eyes  H25.813     10. Glaucoma suspect of both eyes  H40.003      1-3. Rhegmatogenous retinal detachment, right eye  - s/p laser retinopexy OD for tear/focal RD and lattice (3.4.20)  - 7.23.2020: acute bullous superior mac off detachment, onset of foveal involvement Thursday, 07/02/19 by history  - detached from 0900 to 0100 oclock, fovea off, horseshoe tear at 1100 and patches of lattice w/ retinal holes at 1030  - s/p pneumatic cryopexy OD in office (07.23.20) -- good cryo changes surrounding breaks from 1030-1100  - supplemental fill-in laser (07.29.20) - good laser changes  - retina stably reattached  - mild residual SRF completely resolved  - BCVA decreased to 20/40 from 20/25  - IOP 17 OD   - no new RT/RD  - f/u in 1 year, DFE, OCT  4,5.Lattice degeneration w/ atrophic holes, both eyes  - patches of lattice located at 0730, 0800 and 1000 OD, 0430, 0530-0600 OS  - s/p laser retinopexy OD (03.04.20) -- good laser changes in place  - s/p laser retinopexy OS (03.12.20) -- good laser changes in place  - no new RT/RD or lattice OU  - monitor  6. 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   - The  need for close monitoring of blood glucose, blood pressure, and serum lipids, avoiding cigarette or any type of tobacco, and the need for long term follow up was also discussed with patient. - exam shows rare MA - OCT without  diabetic macular edema, right eye  - monitor  7,8. Hypertensive retinopathy OU  - discussed importance of tight BP control  - monitor  9. 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  - discussed diagnosis and progression  - not yet visually significant  - monitor for now  10. Glaucoma Suspect  - IOP today 17,16  - c/d ratio: 0.7,0.5  - pt reports fam hx of glaucoma (brother)  - decreased VA today from 20/25 to 20/40, no retinal indication for reduction in vision and cataracts without significant progression  - will refer to Dr. Lucianne Lei for glaucoma eval  Ophthalmic Meds Ordered this visit:  No orders of the defined types were placed in this encounter.    Return in about 1 year (around 10/25/2022).  There are no Patient Instructions on file for this visit.   Explained the diagnoses, plan, and follow up with the patient and they expressed understanding.  Patient expressed understanding of the importance of proper follow up care.    This document serves as a record of services personally performed by Gardiner Sleeper, MD, PhD. It was created on their behalf by Orvan Falconer, an ophthalmic technician. The creation of this record is the provider's dictation and/or activities during the visit.    Electronically signed by: Orvan Falconer, OA, 10/25/21  11:59 AM  This document serves as a record of services personally performed by Gardiner Sleeper, MD, PhD. It was created on their behalf by San Jetty. Owens Shark, OA an ophthalmic technician. The creation of this record is the provider's dictation and/or activities during the visit.    Electronically signed by: San Jetty. Owens Shark, [place] 11.16.2022 11:59 AM  Gardiner Sleeper, M.D., Ph.D. Diseases & Surgery of the Retina and Vitreous [facility]  I have reviewed the above documentation for accuracy and completeness, and I agree  with the above. Gardiner Sleeper, M.D., Ph.D. 10/25/21 11:59 AM   Abbreviations: M myopia (nearsighted); A astigmatism; H hyperopia (farsighted); P presbyopia; Mrx spectacle prescription;  CTL contact lenses; OD right eye; OS left eye; OU both eyes  XT exotropia; ET esotropia; PEK punctate epithelial keratitis; PEE punctate epithelial erosions; DES dry eye syndrome; MGD meibomian gland dysfunction; ATs artificial tears; PFAT's preservative free artificial tears; [place] nuclear sclerotic cataract; PSC posterior subcapsular cataract; ERM epi-retinal membrane; PVD posterior vitreous detachment; RD retinal detachment; DM diabetes mellitus; DR diabetic retinopathy; NPDR non-proliferative diabetic retinopathy; PDR proliferative diabetic retinopathy; CSME clinically significant macular edema; DME diabetic macular edema; dbh dot blot hemorrhages; CWS cotton wool spot; POAG primary open angle glaucoma; C/D cup-to-disc ratio; HVF humphrey visual field; GVF goldmann visual field; OCT optical coherence tomography; IOP intraocular pressure; BRVO Branch retinal vein occlusion; CRVO central retinal vein occlusion; CRAO central retinal artery occlusion; BRAO branch retinal artery occlusion; RT retinal tear; SB scleral buckle; PPV pars plana vitrectomy; VH Vitreous hemorrhage; PRP panretinal laser photocoagulation; IVK intravitreal kenalog; VMT vitreomacular traction; MH Macular hole;  NVD neovascularization of the disc; NVE neovascularization elsewhere; AREDS age related eye disease study; ARMD age related macular degeneration; POAG primary open angle glaucoma; EBMD epithelial/anterior basement membrane dystrophy; ACIOL anterior chamber intraocular  lens; IOL intraocular lens; PCIOL posterior chamber intraocular lens; Phaco/IOL phacoemulsification with intraocular lens placement; [place] photorefractive keratectomy; LASIK laser assisted in situ keratomileusis; HTN hypertension; DM diabetes mellitus; COPD chronic obstructive pulmonary  disease

## 2021-10-25 ENCOUNTER — Ambulatory Visit (INDEPENDENT_AMBULATORY_CARE_PROVIDER_SITE_OTHER): Payer: BC Managed Care – PPO | Admitting: Ophthalmology

## 2021-10-25 ENCOUNTER — Other Ambulatory Visit: Payer: Self-pay

## 2021-10-25 ENCOUNTER — Encounter (INDEPENDENT_AMBULATORY_CARE_PROVIDER_SITE_OTHER): Payer: Self-pay | Admitting: Ophthalmology

## 2021-10-25 DIAGNOSIS — H35033 Hypertensive retinopathy, bilateral: Secondary | ICD-10-CM | POA: Diagnosis not present

## 2021-10-25 DIAGNOSIS — H25813 Combined forms of age-related cataract, bilateral: Secondary | ICD-10-CM

## 2021-10-25 DIAGNOSIS — H33323 Round hole, bilateral: Secondary | ICD-10-CM

## 2021-10-25 DIAGNOSIS — E113291 Type 2 diabetes mellitus with mild nonproliferative diabetic retinopathy without macular edema, right eye: Secondary | ICD-10-CM

## 2021-10-25 DIAGNOSIS — H35413 Lattice degeneration of retina, bilateral: Secondary | ICD-10-CM

## 2021-10-25 DIAGNOSIS — E119 Type 2 diabetes mellitus without complications: Secondary | ICD-10-CM

## 2021-10-25 DIAGNOSIS — H40003 Preglaucoma, unspecified, bilateral: Secondary | ICD-10-CM

## 2021-10-25 DIAGNOSIS — H33311 Horseshoe tear of retina without detachment, right eye: Secondary | ICD-10-CM

## 2021-10-25 DIAGNOSIS — I10 Essential (primary) hypertension: Secondary | ICD-10-CM | POA: Diagnosis not present

## 2021-10-25 DIAGNOSIS — H3581 Retinal edema: Secondary | ICD-10-CM

## 2021-10-25 DIAGNOSIS — H3321 Serous retinal detachment, right eye: Secondary | ICD-10-CM

## 2022-08-02 ENCOUNTER — Encounter: Payer: Self-pay | Admitting: Orthopedic Surgery

## 2022-08-02 ENCOUNTER — Ambulatory Visit: Payer: BC Managed Care – PPO | Admitting: Orthopedic Surgery

## 2022-08-02 ENCOUNTER — Ambulatory Visit (INDEPENDENT_AMBULATORY_CARE_PROVIDER_SITE_OTHER): Payer: BC Managed Care – PPO

## 2022-08-02 VITALS — BP 138/84 | HR 61 | Ht 64.0 in | Wt 220.0 lb

## 2022-08-02 DIAGNOSIS — M25511 Pain in right shoulder: Secondary | ICD-10-CM | POA: Diagnosis not present

## 2022-08-02 DIAGNOSIS — M7551 Bursitis of right shoulder: Secondary | ICD-10-CM

## 2022-08-02 MED ORDER — METHYLPREDNISOLONE ACETATE 40 MG/ML IJ SUSP
40.0000 mg | Freq: Once | INTRAMUSCULAR | Status: AC
  Administered 2022-08-02: 40 mg via INTRA_ARTICULAR

## 2022-08-02 NOTE — Patient Instructions (Signed)
You have received an injection of steroids into the joint. 15% of patients will have increased pain within the 24 hours postinjection.   This is transient and will go away.   We recommend that you use ice packs on the injection site for 20 minutes every 2 hours and extra strength Tylenol 2 tablets every 8 as needed until the pain resolves.  If you continue to have pain after taking the Tylenol and using the ice please call the office for further instructions.   Home exercises   Take 500 mg of tylenol every 6hrs and ibuprofen 800 mg every 8 hrs as needed   OOW TOMORROW

## 2022-08-02 NOTE — Progress Notes (Signed)
Chief Complaint  Patient presents with   Shoulder Pain    Hx of rt shoulder injury a few yrs ago. Feels like she may have re injured arm a few weeks ago and has no range of motion and pain getting worse.    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  She is not completely locked in terms of her motion but all ranges of motion are painful  Examination of the right shoulder reveals global tenderness normal external rotation with the arm at her side she has active forward elevation and abduction to 90 and then passive range of motion continues after 220 degrees with pain  Imaging x-rays are compared to x-rays taken in 2019 when the patient had an injection for shoulder pain the acromiohumeral distance has decreased there is sclerosis in the acromion and the tuberosity and top of the humeral head  [place] looks normal  Recommend subacromial injection and home exercises  Follow-up in 3 weeks   Procedure note the subacromial injection shoulder RIGHT    Verbal consent was obtained to inject the  RIGHT   Shoulder  Timeout was completed to confirm the injection site is a subacromial space of the  RIGHT  shoulder   Medication used Depo-Medrol 40 mg and lidocaine 1% 3 cc  Anesthesia was provided by ethyl chloride  The injection was performed in the RIGHT  posterior subacromial space. After pinning the skin with alcohol and anesthetized the skin with ethyl chloride the subacromial space was injected using a 20-gauge needle. There were no complications  Sterile dressing was applied.

## 2022-08-23 ENCOUNTER — Ambulatory Visit: Payer: BC Managed Care – PPO | Admitting: Orthopedic Surgery

## 2022-08-23 ENCOUNTER — Encounter: Payer: Self-pay | Admitting: Orthopedic Surgery

## 2022-08-23 DIAGNOSIS — M7551 Bursitis of right shoulder: Secondary | ICD-10-CM | POA: Diagnosis not present

## 2022-08-23 NOTE — Patient Instructions (Signed)
Use Aspercreme, Biofreeze or Voltaren gel over the counter 2-3 times daily make sure you rub it in well each time you use it.   We will see you back if it bothers you again.

## 2022-08-23 NOTE — Progress Notes (Signed)
Chief Complaint  Patient presents with   Shoulder Pain    RT/follow up/improving   Nancy [name] comes back after injection of the right shoulder she has improved significantly with improved range of motion less pain  She has some tenderness over the front of the shoulder  Recommend topical liniment along with Tylenol  Return if any symptoms or come back

## 2022-10-18 NOTE — Progress Notes (Shared)
[facility] Note  10/24/2022    CHIEF COMPLAINT Patient presents for No chief complaint on file.    HISTORY OF PRESENT ILLNESS: Nancy [name] is a 61 y.o. female who presents to the clinic today for:      Referring physician: Redmond School, MD [place],  [place] [id number]  HISTORICAL INFORMATION:  Selected notes from the MEDICAL RECORD NUMBER Referred by Dr. Madelin Headings for concern of retinal holes OD    CURRENT MEDICATIONS: Current Outpatient Medications (Ophthalmic Drugs)  Medication Sig   neomycin-polymyxin-dexameth (MAXITROL) 0.1 % OINT Place 1 application into the right eye 4 (four) times daily.   No current facility-administered medications for this visit. (Ophthalmic Drugs)   Current Outpatient Medications (Other)  Medication Sig   albuterol (PROVENTIL HFA;VENTOLIN HFA) 108 (90 BASE) MCG/ACT inhaler Inhale 2 puffs into the lungs every 6 (six) hours as needed.   Blood Glucose Monitoring Suppl (BLOOD GLUCOSE METER) kit Use as instructed, check sugars one time a day at least every other day.   FARXIGA 10 MG TABS tablet    fluticasone (FLONASE) 50 MCG/ACT nasal spray fluticasone propionate 50 mcg/actuation nasal spray,suspension   Lancets (ONETOUCH DELICA PLUS [id number]) MISC    lisinopril (PRINIVIL,ZESTRIL) 2.5 MG tablet    methocarbamol (ROBAXIN) 500 MG tablet Take 1 or 2 po Q 6hrs for muscle pain   naproxen (NAPROSYN) 250 MG tablet Take 1 po BID with food prn pain   ONE TOUCH ULTRA TEST test strip    No current facility-administered medications for this visit. (Other)   REVIEW OF SYSTEMS:   ALLERGIES Allergies  Allergen Reactions   Cantaloupe (Diagnostic) Anaphylaxis   Strawberry Extract Anaphylaxis   Watermelon Flavor Anaphylaxis   Metformin And Related Other (See Comments)    Lethargic, swelling of the eyes, drowsiness   Penicillins Hives   PAST MEDICAL HISTORY Past Medical History:  Diagnosis Date    Asthma    Cataract    OU   Diabetes mellitus without complication ([place])    Hypertension    Hypertensive retinopathy    OU   Retinal detachment 07/02/2019   OD   Past Surgical History:  Procedure Laterality Date   BREAST SURGERY     REPLACEMENT TOTAL KNEE     RETINAL DETACHMENT SURGERY Right 07/02/2019   Pneumatic retinopexy - Dr. Bernarda Caffey   right foot     FAMILY HISTORY Family History  Problem Relation Age of Onset   Anesthesia problems Other        family history    Diabetes Other        family history    Glaucoma Sister    Glaucoma Brother    SOCIAL HISTORY Social History   Tobacco Use   Smoking status: Never   Smokeless tobacco: Never  Substance Use Topics   Alcohol use: No   Drug use: No       OPHTHALMIC EXAM: Not recorded    IMAGING AND PROCEDURES  Imaging and Procedures for _0 @           ASSESSMENT/PLAN:  No diagnosis found.  1-3. Rhegmatogenous retinal detachment, right eye  - s/p laser retinopexy OD for tear/focal RD and lattice (3.4.20)  - 7.23.2020: acute bullous superior mac off detachment, onset of foveal involvement Thursday, 07/02/19 by history  - detached from 0900 to 0100 oclock, fovea off, horseshoe tear at 1100 and patches of lattice w/ retinal holes at 1030  - s/p pneumatic  cryopexy OD in office (07.23.20) -- good cryo changes surrounding breaks from 1030-1100  - supplemental fill-in laser (07.29.20) - good laser changes  - retinal stably reattached   - mild residual SRF completely resolved  - BCVA decreased to 20/40 from 20/25  - IOP 17 OD   - no new RT/RD  - f/u in 1 year, DFE, OCT  4,5.Lattice degeneration w/ atrophic holes, both eyes  - patches of lattice located at 0730, 0800 and 1000 OD, 0430, 0530-0600 OS  - s/p laser retinopexy OD (03.04.20) -- good laser changes in place  - s/p laser retinopexy OS (03.12.20) -- good laser changes in place  - no new RT/RD or lattice OU  - monitor  6. Mild nonproliferative  diabetic retinopathy w/o DME, right eye - The incidence, risk factors for progression, natural history and treatment options for diabetic retinopathy were discussed with patient.   - The need for close monitoring of blood glucose, blood pressure, and serum lipids, avoiding cigarette or any type of tobacco, and the need for long term follow up was also discussed with patient. - exam shows rare MA - OCT without diabetic macular edema, right eye  - monitor  7,8. Hypertensive retinopathy OU  - discussed importance of tight BP control  - monitor  9. 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  - discussed diagnosis and progression  - not yet visually significant  - monitor for now  10. Glaucoma Suspect  -IOP today: 17,16  - c/d ratio: 0.7,0.5  - pt reports fam hx of glaucoma (brother)  - decreased VA today from 20/25 to 20/40, no retinal indication for reduction in vision and cataracts without significant progression  - will refer to Dr. Lucianne Lei for glaucoma eval  Ophthalmic Meds Ordered this visit:  No orders of the defined types were placed in this encounter.     No follow-ups on file.  There are no Patient Instructions on file for this visit.   Explained the diagnoses, plan, and follow up with the patient and they expressed understanding.  Patient expressed understanding of the importance of proper follow up care.    This document serves as a record of services personally performed by Gardiner Sleeper, MD, PhD. It was created on their behalf by Orvan Falconer, an ophthalmic technician. The creation of this record is the provider's dictation and/or activities during the visit.    Electronically signed by: Orvan Falconer, OA, 10/18/22  1:50 PM    Gardiner Sleeper, M.D., Ph.D. Diseases & Surgery of the Retina and Vitreous [facility]: M  myopia (nearsighted); A astigmatism; H hyperopia (farsighted); P presbyopia; Mrx spectacle prescription;  CTL contact lenses; OD right eye; OS left eye; OU both eyes  XT exotropia; ET esotropia; PEK punctate epithelial keratitis; PEE punctate epithelial erosions; DES dry eye syndrome; MGD meibomian gland dysfunction; ATs artificial tears; PFAT's preservative free artificial tears; [place] nuclear sclerotic cataract; PSC posterior subcapsular cataract; ERM epi-retinal membrane; PVD posterior vitreous detachment; RD retinal detachment; DM diabetes mellitus; DR diabetic retinopathy; NPDR non-proliferative diabetic retinopathy; PDR proliferative diabetic retinopathy; CSME clinically significant macular edema; DME diabetic macular edema; dbh dot blot hemorrhages; CWS cotton wool spot; POAG primary open angle glaucoma; C/D cup-to-disc ratio; HVF humphrey visual field; GVF goldmann visual field; OCT optical coherence tomography; IOP intraocular pressure; BRVO Branch retinal vein occlusion; CRVO central  retinal vein occlusion; CRAO central retinal artery occlusion; BRAO branch retinal artery occlusion; RT retinal tear; SB scleral buckle; PPV pars plana vitrectomy; VH Vitreous hemorrhage; PRP panretinal laser photocoagulation; IVK intravitreal kenalog; VMT vitreomacular traction; MH Macular hole;  NVD neovascularization of the disc; NVE neovascularization elsewhere; AREDS age related eye disease study; ARMD age related macular degeneration; POAG primary open angle glaucoma; EBMD epithelial/anterior basement membrane dystrophy; ACIOL anterior chamber intraocular lens; IOL intraocular lens; PCIOL posterior chamber intraocular lens; Phaco/IOL phacoemulsification with intraocular lens placement; [place] photorefractive keratectomy; LASIK laser assisted in situ keratomileusis; HTN hypertension; DM diabetes mellitus; COPD chronic obstructive pulmonary disease

## 2022-10-24 ENCOUNTER — Encounter (INDEPENDENT_AMBULATORY_CARE_PROVIDER_SITE_OTHER): Payer: Self-pay

## 2022-10-24 ENCOUNTER — Encounter (INDEPENDENT_AMBULATORY_CARE_PROVIDER_SITE_OTHER): Payer: BC Managed Care – PPO | Admitting: Ophthalmology

## 2022-10-24 DIAGNOSIS — E113291 Type 2 diabetes mellitus with mild nonproliferative diabetic retinopathy without macular edema, right eye: Secondary | ICD-10-CM

## 2022-10-24 DIAGNOSIS — H25813 Combined forms of age-related cataract, bilateral: Secondary | ICD-10-CM

## 2022-10-24 DIAGNOSIS — H3321 Serous retinal detachment, right eye: Secondary | ICD-10-CM

## 2022-10-24 DIAGNOSIS — H35033 Hypertensive retinopathy, bilateral: Secondary | ICD-10-CM

## 2022-10-24 DIAGNOSIS — H33323 Round hole, bilateral: Secondary | ICD-10-CM

## 2022-10-24 DIAGNOSIS — H40003 Preglaucoma, unspecified, bilateral: Secondary | ICD-10-CM

## 2022-10-24 DIAGNOSIS — H35413 Lattice degeneration of retina, bilateral: Secondary | ICD-10-CM

## 2022-10-24 DIAGNOSIS — H33311 Horseshoe tear of retina without detachment, right eye: Secondary | ICD-10-CM

## 2022-10-24 DIAGNOSIS — E119 Type 2 diabetes mellitus without complications: Secondary | ICD-10-CM

## 2022-10-24 DIAGNOSIS — I10 Essential (primary) hypertension: Secondary | ICD-10-CM

## 2023-02-07 ENCOUNTER — Encounter: Payer: Self-pay | Admitting: Radiology

## 2023-05-08 ENCOUNTER — Telehealth: Payer: Self-pay | Admitting: Orthopedic Surgery

## 2023-05-08 NOTE — Telephone Encounter (Signed)
HARRISON   Please call Joni Reining at the dentist office she left a voicemail at 11:46 am stating Keonda Fausett had a knee replacement in 2011 and she wants to know if she needs to pre medicate.   Please call her back at [phone]

## 2023-05-09 MED ORDER — CEPHALEXIN 500 MG PO CAPS: 2000.0000 mg | ORAL_CAPSULE | Freq: Once | ORAL | 2 refills | Status: AC

## 2023-05-09 NOTE — Telephone Encounter (Signed)
Sent in meds per protocol will call dentist when not in clinic

## 2023-05-09 NOTE — Telephone Encounter (Signed)
Called to advise yes we need pre meds and  meds have been sent in

## 2023-08-08 ENCOUNTER — Encounter: Payer: BC Managed Care – PPO | Admitting: Orthopedic Surgery

## 2023-08-15 ENCOUNTER — Encounter (HOSPITAL_COMMUNITY): Payer: Self-pay | Admitting: Emergency Medicine

## 2023-08-15 ENCOUNTER — Other Ambulatory Visit: Payer: Self-pay

## 2023-08-15 ENCOUNTER — Emergency Department (HOSPITAL_COMMUNITY)
Admission: EM | Admit: 2023-08-15 | Discharge: 2023-08-15 | Disposition: A | Discharge status: Home / Self Care | Payer: BC Managed Care – PPO | Attending: Emergency Medicine | Admitting: Emergency Medicine

## 2023-08-15 DIAGNOSIS — Z7984 Long term (current) use of oral hypoglycemic drugs: Secondary | ICD-10-CM | POA: Insufficient documentation

## 2023-08-15 DIAGNOSIS — M7989 Other specified soft tissue disorders: Secondary | ICD-10-CM | POA: Diagnosis present

## 2023-08-15 DIAGNOSIS — I10 Essential (primary) hypertension: Secondary | ICD-10-CM | POA: Insufficient documentation

## 2023-08-15 DIAGNOSIS — E119 Type 2 diabetes mellitus without complications: Secondary | ICD-10-CM | POA: Diagnosis not present

## 2023-08-15 LAB — CBC WITH DIFFERENTIAL/PLATELET
Abs Immature Granulocytes: 0.02 10*3/uL (ref 0.00–0.07)
Basophils Absolute: 0 10*3/uL (ref 0.0–0.1)
Basophils Relative: 0 %
Eosinophils Absolute: 0.2 10*3/uL (ref 0.0–0.5)
Eosinophils Relative: 3 %
HCT: 38.7 % (ref 36.0–46.0)
Hemoglobin: 12.8 g/dL (ref 12.0–15.0)
Immature Granulocytes: 0 %
Lymphocytes Relative: 36 %
Lymphs Abs: 2.8 10*3/uL (ref 0.7–4.0)
MCH: 26.6 pg (ref 26.0–34.0)
MCHC: 33.1 g/dL (ref 30.0–36.0)
MCV: 80.5 fL (ref 80.0–100.0)
Monocytes Absolute: 0.6 10*3/uL (ref 0.1–1.0)
Monocytes Relative: 7 %
Neutro Abs: 4.2 10*3/uL (ref 1.7–7.7)
Neutrophils Relative %: 54 %
Platelets: 263 10*3/uL (ref 150–400)
RBC: 4.81 MIL/uL (ref 3.87–5.11)
RDW: 15.7 % — ABNORMAL HIGH (ref 11.5–15.5)
WBC: 7.9 10*3/uL (ref 4.0–10.5)
nRBC: 0 % (ref 0.0–0.2)

## 2023-08-15 LAB — BASIC METABOLIC PANEL
Anion gap: 8 (ref 5–15)
BUN: 20 mg/dL (ref 8–23)
CO2: 27 mmol/L (ref 22–32)
Calcium: 9.2 mg/dL (ref 8.9–10.3)
Chloride: 102 mmol/L (ref 98–111)
Creatinine, Ser: 0.93 mg/dL (ref 0.44–1.00)
GFR, Estimated: >60 mL/min (ref >60)
Glucose, Bld: 111 mg/dL — ABNORMAL HIGH (ref 70–99)
Potassium: 3.7 mmol/L (ref 3.5–5.1)
Sodium: 137 mmol/L (ref 135–145)

## 2023-08-15 NOTE — ED Provider Notes (Signed)
[facility] [facility] AT [facility] Provider Note   CSN: [id number] Arrival date & time: 08/15/23  1959     History Chief Complaint  Patient presents with   Leg Pain    Nancy [name] is a 62 y.o. female patient with history of diabetes and hypertension who presents to the emergency department with right calf pain that started 1 week ago.  Patient states she works at one of the [employer] when she felt a pain in her right leg.  She thought that it was a pulled muscle and was doing conservative therapy at home.  Pain has not dissipated now the right leg has become a little bit more warm to palpation and swollen.  Pain is worse when she is on her feet but better when her legs are elevated.  She denies any chest pain, shortness of breath, syncope, prior DVT/PE.   Leg Pain      Home Medications Prior to Admission medications   Medication Sig Start Date End Date Taking? Authorizing Provider  acetaminophen (TYLENOL) 650 MG CR tablet Take 650 mg by mouth every 8 (eight) hours as needed for pain or fever.   Yes [provider]  albuterol (PROVENTIL HFA;VENTOLIN HFA) 108 (90 BASE) MCG/ACT inhaler Inhale 2 puffs into the lungs every 6 (six) hours as needed. 03/11/14  Yes Rucker, Magdalen Spatz, MD  Dextromethorphan-guaiFENesin 10-100 MG/5ML liquid Take 5 mLs by mouth every 12 (twelve) hours.   Yes [provider]  dorzolamide (TRUSOPT) 2 % ophthalmic solution Place 1 drop into both eyes 3 (three) times daily. 08/23/22  Yes [provider]  fluticasone (FLONASE) 50 MCG/ACT nasal spray fluticasone propionate 50 mcg/actuation nasal spray,suspension   Yes [provider]  glipiZIDE (GLUCOTROL) 5 MG tablet Take 2.5 mg by mouth daily. 04/19/23  Yes [provider]  latanoprost (XALATAN) 0.005 % ophthalmic solution Place into both eyes at bedtime. 08/23/22  Yes [provider]  levofloxacin (LEVAQUIN) 500 MG tablet  Take 500 mg by mouth daily. 08/13/23  Yes [provider]  Semaglutide (RYBELSUS) 3 MG TABS Take 3 mg by mouth daily.   Yes [provider]  Blood Glucose Monitoring Suppl (BLOOD GLUCOSE METER) kit Use as instructed, check sugars one time a day at least every other day. 03/29/14   Suzan Slick, MD  Lancets [facility] [place] PLUS [place]) MISC  01/05/19   [provider]  ONE TOUCH ULTRA TEST test strip  11/18/18   [provider]      Allergies    Beeswax, Cantaloupe (diagnostic), Ibuprofen, Other, Penicillins, Strawberry extract, Watermelon flavor, Beef-derived products, and Metformin and related    Review of Systems   Review of Systems  All other systems reviewed and are negative.   Physical Exam Updated Vital Signs BP (!) 154/80 (BP Location: Left Arm)   Pulse 69   Temp 99.4 F (37.4 C) (Oral)   Resp 18   Ht 5\' 4"  (1.626 m)   Wt 99.8 kg   SpO2 99%   BMI 37.76 kg/m  Physical Exam Vitals and nursing note reviewed.  Constitutional:      Appearance: Normal appearance.  HENT:     Head: Normocephalic and atraumatic.  Eyes:     General:        Right eye: No discharge.        Left eye: No discharge.     Conjunctiva/sclera: Conjunctivae normal.  Pulmonary:     Effort: Pulmonary  effort is normal.  Musculoskeletal:     Comments: Negative Homans' sign.  There is calf tenderness to the right calf with palpation.  Right leg is slightly warm to palpation and is swollen in comparison to the left.  Bilateral dorsalis pedis pulses 2+.  Good sensation in the toes.  Good range of motion in the feet and ankles.  Skin:    General: Skin is warm and dry.     Findings: No rash.  Neurological:     General: No focal deficit present.     Mental Status: She is alert.  Psychiatric:        Mood and Affect: Mood normal.        Behavior: Behavior normal.     ED Results / Procedures / Treatments   Labs (all labs ordered are listed, but only abnormal  results are displayed) Labs Reviewed  CBC WITH DIFFERENTIAL/PLATELET  BASIC METABOLIC PANEL    EKG None  Radiology No results found.  Procedures Procedures    Medications Ordered in ED Medications - No data to display  ED Course/ Medical Decision Making/ A&P   {   Click here for ABCD2, HEART and other calculators  Medical Decision Making Nancy [name] is a 62 y.o. female patient who presents to the emergency department today for further evaluation of right leg pain and swelling.  I am suspicious for possible DVT in the right leg.  However, ultrasound has left for the day and I will likely have to write a prescription for the patient to return to the emergency department for an ultrasound tomorrow.  Patient's vital signs are otherwise normal apart from some slightly high blood pressure.  She is oxygenating well on room air and is not tachypneic or tachycardic.  I have a low suspicion at this time for pulmonary embolism as the patient is not having any exertional dyspnea or syncope type symptoms.  I will plan to get basic labs here preemptively if the patient needs to be on anticoagulation after the DVT ultrasound tomorrow so we have a baseline creatinine function.  Patient is in agreement with plan.  All questions or concerns addressed.  She is safe for discharge.   Amount and/or Complexity of Data Reviewed Labs: ordered.    Final Clinical Impression(s) / ED Diagnoses Final diagnoses:  Right leg swelling    Rx / DC Orders ED Discharge Orders          Ordered    US Venous Img Lower Unilateral Right        08/15/23 2229              Teressa Lower, PA-C 08/15/23 2231    Glyn Ade, MD 08/16/23 [phone]

## 2023-08-15 NOTE — ED Triage Notes (Signed)
Pt to ed pov. C/o of right calf pain that started last Friday while at work. Pt has noticeable swelling to right lower leg that pt states is not normal for them. Pt's calf is painful to touch. Pt is ambulatory

## 2023-08-15 NOTE — Discharge Instructions (Addendum)
Please come back tomorrow morning to get an ultrasound of the right leg.  Please call tomorrow to make sure they are available for you.  I have already written the prescription for you to obtain the ultrasound.  Please return to the emergency room sooner for any worsening symptoms.

## 2023-08-16 ENCOUNTER — Encounter: Payer: Self-pay | Admitting: Orthopedic Surgery

## 2023-08-16 ENCOUNTER — Ambulatory Visit (HOSPITAL_COMMUNITY)
Admission: RE | Admit: 2023-08-16 | Discharge: 2023-08-16 | Disposition: A | Discharge status: Home / Self Care | Payer: BC Managed Care – PPO | Source: Ambulatory Visit | Attending: Emergency Medicine | Admitting: Emergency Medicine

## 2023-08-16 ENCOUNTER — Other Ambulatory Visit (HOSPITAL_COMMUNITY): Payer: Self-pay | Admitting: Emergency Medicine

## 2023-08-16 DIAGNOSIS — M7989 Other specified soft tissue disorders: Secondary | ICD-10-CM

## 2023-08-16 NOTE — ED Provider Notes (Signed)
Patient came in for outpatient ultrasound ordered yesterday for complaint of right calf pain.  Results and ultrasound is negative for radiology report.  On exam she has tenderness along posterior right leg, happened after what she felt was a pulled muscle but is not getting better, advised support with Ace wrap, orthopedic follow-up, rest and return precautions.  She was agreeable with this.  There are no signs of cellulitis on her exam.   Ma Rings, PA-C 08/16/23 1036    Loetta Rough, MD 08/16/23 1135

## 2023-08-22 ENCOUNTER — Encounter: Payer: BC Managed Care – PPO | Admitting: Orthopedic Surgery

## 2023-09-05 ENCOUNTER — Encounter: Payer: BC Managed Care – PPO | Admitting: Orthopedic Surgery

## 2023-09-24 ENCOUNTER — Other Ambulatory Visit (HOSPITAL_COMMUNITY): Payer: Self-pay | Admitting: Internal Medicine

## 2023-09-24 DIAGNOSIS — Z1231 Encounter for screening mammogram for malignant neoplasm of breast: Secondary | ICD-10-CM

## 2023-11-26 ENCOUNTER — Telehealth: Payer: Self-pay | Admitting: Orthopedic Surgery

## 2023-11-26 NOTE — Telephone Encounter (Signed)
DR. Romeo Apple.   Patient called at 4:36 pm yesterday and LVM that she fell at work and fell on her left knee and it is feeling cold.    I called the patient this morning and advised her if it is workers comp she will need to get with her employer and they will have to approve for her to come and be seen here.   She states she is a patient here.  I looked back and advised her she was seen on 08/23/22, cancel 08/08/23 cancel on 08/08/23 and cancel on 08/22/23 and no show on 09/05/23.  She asked if she could call back I said yes.  She is going to call us back later she is at work.

## 2023-11-26 NOTE — Telephone Encounter (Signed)
Spoke w/the patient, she stated that this is not W/C.  She has scheduled an appointment for January.  I did offer an appt for 12/19, but she was not able to do that due to the copay.  She requested a January appt.  She asked if she could bring her copay by sooner so she doesn't spend it, I told her we would not be able to post it until the day of her visit, but they may can put it in an envelope and lock it in the drawer up front until her appt.  I have put this on the appt note too.

## 2023-12-19 ENCOUNTER — Encounter: Payer: BC Managed Care – PPO | Admitting: Orthopedic Surgery

## 2023-12-26 ENCOUNTER — Encounter: Payer: BC Managed Care – PPO | Admitting: Orthopedic Surgery

## 2024-01-08 NOTE — Progress Notes (Unsigned)
   There were no vitals taken for this visit.  There is no height or weight on file to calculate BMI.  No chief complaint on file.   No diagnosis found.  DOI/DOS/ {Date:23773::"***"}  {CHL AMB ORT SYMPTOMS POST TREATMENT:21798}

## 2024-01-09 ENCOUNTER — Ambulatory Visit (INDEPENDENT_AMBULATORY_CARE_PROVIDER_SITE_OTHER): Payer: 59 | Admitting: Orthopedic Surgery

## 2024-01-09 VITALS — BP 114/72 | HR 80 | Ht 64.0 in | Wt 219.0 lb

## 2024-01-09 DIAGNOSIS — M7551 Bursitis of right shoulder: Secondary | ICD-10-CM

## 2024-01-09 MED ORDER — METHYLPREDNISOLONE ACETATE 40 MG/ML IJ SUSP
40.0000 mg | Freq: Once | INTRAMUSCULAR | Status: AC
Start: 2024-01-09 — End: 2024-01-09
  Administered 2024-01-09: 40 mg via INTRA_ARTICULAR

## 2024-01-09 NOTE — Progress Notes (Signed)
  Subjective:     Patient ID: Nancy [name], female   DOB: Apr 10, 1961, 63 y.o.   MRN: [id number]    BP 114/72   Pulse 80   Ht 5\' 4"  (1.626 m)   Wt 219 lb (99.3 kg)   BMI 37.59 kg/m   Body mass index is 37.59 kg/m.  Chief Complaint Patient presents with  Shoulder Pain   Right shoulder   Foot Problem   Has seen foot doctor for right foot states has a corn    Encounter Diagnosis Name Primary?  Bursitis of right shoulder Yes   DOI/DOS/ Date: NA  Worse with shoulder pain for a few months         Shoulder Pain    63 year old female previous injection helped with her right shoulder pain  No trauma  Patient is working at [employer] doing a lot of lifting appears to have pain in the front of the shoulder which is worse with forward elevation without weakness  Review of Systems     Objective:   Physical Exam Right shoulder tender anterior in the bicipital groove some pain with extension also has a positive impingement sign cuff strength is normal    Assessment:     Primarily impingement syndrome may have a hint of biceps tendinitis did well with subacromial injection so we will repeated    Plan:      Procedure note the subacromial injection shoulder RIGHT    Verbal consent was obtained to inject the  RIGHT   Shoulder  Timeout was completed to confirm the injection site is a subacromial space of the  RIGHT  shoulder   Medication used Depo-Medrol 40 mg and lidocaine 1% 3 cc  Anesthesia was provided by ethyl chloride  The injection was performed in the RIGHT  posterior subacromial space. After pinning the skin with alcohol and anesthetized the skin with ethyl chloride the subacromial space was injected using a 20-gauge needle. There were no complications  Sterile dressing was applied.

## 2024-01-09 NOTE — Patient Instructions (Signed)
You have received an injection of steroids into the joint. 15% of patients will have increased pain within the 24 hours postinjection.   This is transient and will go away.   We recommend that you use ice packs on the injection site for 20 minutes every 2 hours and extra strength Tylenol 2 tablets every 8 as needed until the pain resolves.  If you continue to have pain after taking the Tylenol and using the ice please call the office for further instructions.

## 2024-03-06 ENCOUNTER — Encounter: Payer: Self-pay | Admitting: Orthopedic Surgery

## 2024-03-06 ENCOUNTER — Ambulatory Visit: Admitting: Orthopedic Surgery

## 2024-03-06 DIAGNOSIS — M25512 Pain in left shoulder: Secondary | ICD-10-CM

## 2024-03-06 DIAGNOSIS — S46912A Strain of unspecified muscle, fascia and tendon at shoulder and upper arm level, left arm, initial encounter: Secondary | ICD-10-CM

## 2024-03-06 NOTE — Patient Instructions (Signed)
 Avoid upper extremity shoulder exercises until pain resolves

## 2024-03-06 NOTE — Progress Notes (Signed)
 Left shoulder pain- injured at gym

## 2024-03-06 NOTE — Progress Notes (Signed)
 Patient ID: Nancy [name], female   DOB: 16-Mar-1961, 63 y.o.   MRN: [id number]  Chief Complaint  Patient presents with   Shoulder Pain    Left shoulder pain   64 year old female with periscapular pain left shoulder thought to be brought on by recent gym activity  The is over the medial superior angle of the scapula radiates somewhat into the neck area exacerbated by reaching across the chest  Exam findings include  Full range of motion of the left shoulder normal strength pain at the attachment of the levator scapula  Assessment and plan  Encounter Diagnoses  Name Primary?   Periscapular pain of left shoulder Yes   Shoulder strain, left, initial encounter     Recommend relative rest activity modification avoid exercises that exacerbate the problem Continued Tylenol Extra Strength or Tylenol arthritis   Return if no improvement

## 2024-03-06 NOTE — Progress Notes (Signed)
 Left shoulder pain- believes injured at gym DOI Mar 15- taking this week arthritis tylenol mg

## 2024-03-19 NOTE — Progress Notes (Addendum)
 Ophthalmology Department Clinical Visit Note   Referred by Dr. Fleeta for glaucoma evaluation. FHx of glaucoma in brother  Risk factors: African descent  CCT: 436/445  Previous ocular procedures: Laser retinopexy Dr. Valdemar 2020  Drop intolerance or allergies: intolerant of beta blockers  Tmax: unknown  Target IOP: Lteens  HVF 03/20/24 OD: Double arc, stable from 10/23 OS: GRS, likely early inf arc, likely stable from 10/23  OCT RNFL 03/20/24 RNFL: OD: Severe sup and inf thin, stable from 10/23 OS: Severe sup thin, stable from 10/23 Asymmetry Analysis: OD: Severe inf thin, stable from 10/23 OS: Moderate inf thin, stable from 10/23   Impression/Plan: 1. Primary open angle glaucoma (POAG) of right eye, severe stage  Optic Disc Photos - OU - Both Eyes   Humphrey Visual Field - OU - Both Eyes   OCT RNFL - OU - Both Eyes    2. Primary open angle glaucoma (POAG) of left eye, moderate stage      3. Nuclear sclerotic cataract of both eyes      4. Retinal detachment, right      5. Diabetes mellitus without complication    (CMD)        POAG Severe OD, Moderate OS - Clinical picture clouded by hx RRD OD - Discussed with patient the nature of glaucoma, risk for irreversible vision loss and blindness if nonadherent with treatment or follow up. Glaucoma is a permanent, progressive condition requiring doctor-patient collaboration to achieve therapeutic goals.  - LTFU from 09/13/22 - 03/20/24 - Very thin pachs - Currently using: Latan QHS OU, Dorzolamide TID OU - IOP at target OU  RTC in 1 year for HVF, OCT, DFE. Will see her optometrist in the interim.  Will continue to follow at [facility] in interim (doesn't recall name of doctor, will message back so I can send a letter).   Nuclear Sclerotic Cataract OU - Not visually significant  Retinal detachment OD -s/p laser retinopexy vs pneumatic by Dr. Valdemar 2020    Diabetes without ocular manifestations  - No diabetic  retinopathy  - Continued glucose control per primary care and patient  - Annual dilated exam due 03/2025    Explained the diagnoses, plan, and follow up with the patient and they expressed understanding.  Patient expressed understanding of the importance of proper follow up care.   Return in about 1 year (around 03/20/2025) for HVF 24-2, OCT RNFL, DFE, okay to dilate before MD.  This document serves as a record of services personally performed by Laverda MATSU. Alena, MD. It was created on their behalf by Charmaine A. Georgina, a trained medical scribe. The creation of this record is the provider's dictation and/or activities during the visit.

## 2024-06-08 ENCOUNTER — Other Ambulatory Visit (HOSPITAL_COMMUNITY): Payer: Self-pay | Admitting: Internal Medicine

## 2024-06-08 DIAGNOSIS — R109 Unspecified abdominal pain: Secondary | ICD-10-CM

## 2024-06-09 ENCOUNTER — Encounter (INDEPENDENT_AMBULATORY_CARE_PROVIDER_SITE_OTHER): Payer: Self-pay | Admitting: *Deleted

## 2024-06-10 ENCOUNTER — Encounter (INDEPENDENT_AMBULATORY_CARE_PROVIDER_SITE_OTHER): Payer: Self-pay | Admitting: *Deleted

## 2024-06-10 ENCOUNTER — Ambulatory Visit (HOSPITAL_BASED_OUTPATIENT_CLINIC_OR_DEPARTMENT_OTHER)
Admission: RE | Admit: 2024-06-10 | Discharge: 2024-06-10 | Disposition: A | Discharge status: Home / Self Care | Source: Ambulatory Visit | Attending: Internal Medicine | Admitting: Internal Medicine

## 2024-06-10 DIAGNOSIS — R109 Unspecified abdominal pain: Secondary | ICD-10-CM | POA: Diagnosis present

## 2024-06-10 LAB — POCT I-STAT CREATININE: Creatinine, Ser: 1 mg/dL (ref 0.44–1.00)

## 2024-06-10 MED ORDER — IOHEXOL 300 MG/ML  SOLN
100.0000 mL | Freq: Once | INTRAMUSCULAR | Status: AC | PRN
  Administered 2024-06-10: 100 mL via INTRAVENOUS

## 2024-06-15 ENCOUNTER — Telehealth (INDEPENDENT_AMBULATORY_CARE_PROVIDER_SITE_OTHER): Payer: Self-pay | Admitting: Gastroenterology

## 2024-06-15 ENCOUNTER — Telehealth (INDEPENDENT_AMBULATORY_CARE_PROVIDER_SITE_OTHER): Payer: Self-pay | Admitting: *Deleted

## 2024-06-15 NOTE — Telephone Encounter (Signed)
 Pt left voicemail wanting to know if we were able to see her CT scan.  Pt has CT scan completed by PCP-results are in Epic. Returned call to pt but had to leave message that we are able to see CT scan.

## 2024-06-15 NOTE — Telephone Encounter (Signed)
 Patient stopped by office asking for results of her CT ordered by Dr Bertell - we explained we couldn't give results since we didn't  order it and technically she wasn't our patient as we have not seen her yet. advised patient to check back with Dr Bertell office for results and we would call her if we get cancellation this week

## 2024-06-17 ENCOUNTER — Ambulatory Visit (INDEPENDENT_AMBULATORY_CARE_PROVIDER_SITE_OTHER): Admitting: Gastroenterology

## 2024-06-17 ENCOUNTER — Telehealth (INDEPENDENT_AMBULATORY_CARE_PROVIDER_SITE_OTHER): Payer: Self-pay | Admitting: Gastroenterology

## 2024-06-17 ENCOUNTER — Encounter (INDEPENDENT_AMBULATORY_CARE_PROVIDER_SITE_OTHER): Payer: Self-pay

## 2024-06-17 ENCOUNTER — Encounter (INDEPENDENT_AMBULATORY_CARE_PROVIDER_SITE_OTHER): Payer: Self-pay | Admitting: Gastroenterology

## 2024-06-17 VITALS — BP 143/62 | HR 64 | Temp 98.1°F | Ht 64.0 in | Wt 214.3 lb

## 2024-06-17 DIAGNOSIS — K5900 Constipation, unspecified: Secondary | ICD-10-CM

## 2024-06-17 DIAGNOSIS — R7989 Other specified abnormal findings of blood chemistry: Secondary | ICD-10-CM | POA: Diagnosis not present

## 2024-06-17 DIAGNOSIS — K760 Fatty (change of) liver, not elsewhere classified: Secondary | ICD-10-CM | POA: Insufficient documentation

## 2024-06-17 DIAGNOSIS — K5904 Chronic idiopathic constipation: Secondary | ICD-10-CM | POA: Insufficient documentation

## 2024-06-17 DIAGNOSIS — R1084 Generalized abdominal pain: Secondary | ICD-10-CM | POA: Insufficient documentation

## 2024-06-17 DIAGNOSIS — R1013 Epigastric pain: Secondary | ICD-10-CM | POA: Diagnosis not present

## 2024-06-17 DIAGNOSIS — R748 Abnormal levels of other serum enzymes: Secondary | ICD-10-CM | POA: Insufficient documentation

## 2024-06-17 DIAGNOSIS — R10814 Left lower quadrant abdominal tenderness: Secondary | ICD-10-CM | POA: Diagnosis not present

## 2024-06-17 MED ORDER — PEG 3350-KCL-NA BICARB-NACL 420 G PO SOLR
4000.0000 mL | Freq: Once | ORAL | 0 refills | Status: AC
Start: 2024-06-17 — End: 2024-06-17

## 2024-06-17 MED ORDER — POLYETHYLENE GLYCOL 3350 17 G PO PACK
17.0000 g | PACK | Freq: Two times a day (BID) | ORAL | 0 refills | Status: AC
Start: 2024-06-17 — End: 2024-09-15

## 2024-06-17 MED ORDER — OMEPRAZOLE 20 MG PO CPDR: 20.0000 mg | DELAYED_RELEASE_CAPSULE | Freq: Every day | ORAL | 3 refills | Status: AC

## 2024-06-17 NOTE — Progress Notes (Signed)
 Slayde Brault Faizan Rosaisela Jamroz , M.D. Gastroenterology & Hepatology [facility] Gastroenterology [address] [place], [place] [address] Primary Care Physician: Bertell Satterfield, MD [address] [place] [address]  Chief Complaint: Abdominal pain, altered bowel movement, diarrhea, screening colonoscopy  History of Present Illness:  Nancy [name] is a 63 y.o. female with diabetes on Rybelsus and hypertension who presents for evaluation of Abdominal pain, altered bowel movement, diarrhea, screening colonoscopy  Patient reports she had sudden onset epigastric pain radiating to left upper and left lower quadrant abdomen which woke her in the middle of the night.  Since then her bowel movements have been altered with liquid stools mostly.  Patient is feeling uncomfortable with epigastric left lower abdominal discomfort.  Patient denies any NSAID use The patient denies having any nausea, vomiting, fever, chills, hematochezia, melena, hematemesis jaundice, pruritus or weight loss.  Last ZHI:wnwz Last Colonoscopy:none  Recent labs 06/2024 H. pylori stool antigen negative Hemoglobin 13.2 ALT 37 AST 15   FHx: neg for any gastrointestinal/liver disease, no malignancies Social: neg smoking, alcohol or illicit drug use Surgical: no abdominal surgeries  Past Medical History: Past Medical History:  Diagnosis Date   Asthma    Cataract    OU   Diabetes mellitus without complication (HCC)    Hypertension    Hypertensive retinopathy    OU   Retinal detachment 07/02/2019   OD    Past Surgical History: Past Surgical History:  Procedure Laterality Date   BREAST SURGERY     REPLACEMENT TOTAL KNEE     RETINAL DETACHMENT SURGERY Right 07/02/2019   Pneumatic retinopexy - Dr. Redell Hans   right foot      Family History: Family History  Problem Relation Age of Onset   Anesthesia problems Other        family history    Diabetes Other        family history     Glaucoma Sister    Glaucoma Brother     Social History: Social History   Tobacco Use  Smoking Status Never  Smokeless Tobacco Never   Social History   Substance and Sexual Activity  Alcohol Use No   Social History   Substance and Sexual Activity  Drug Use No    Allergies: Allergies  Allergen Reactions   Beeswax Anaphylaxis and Other (See Comments)   Cantaloupe (Diagnostic) Anaphylaxis   Ibuprofen Itching and Anaphylaxis   Other Anaphylaxis    Lethargic, swelling of the eyes, drowsiness Eye drop (not any current eye drops)   Penicillins Hives, Other (See Comments) and Anaphylaxis   Strawberry Extract Anaphylaxis   Watermelon Flavoring Agent (Non-Screening) Anaphylaxis   Beef-Derived Drug Products Swelling   Honey     Eye swelling and blurred vision   Metformin      Eye swelling and blurred vision   Metformin  And Related Other (See Comments)    Lethargic, swelling of the eyes, drowsiness   Mixed Feathers     unknown    Medications: Current Outpatient Medications  Medication Sig Dispense Refill   acetaminophen  (TYLENOL ) 650 MG CR tablet Take 650 mg by mouth every 8 (eight) hours as needed for pain or fever.     albuterol  (PROVENTIL  HFA;VENTOLIN  HFA) 108 (90 BASE) MCG/ACT inhaler Inhale 2 puffs into the lungs every 6 (six) hours as needed. 8.5 g 1   Blood Glucose Monitoring Suppl (BLOOD GLUCOSE METER) kit Use as instructed, check sugars one time a day at least every other day.  1 each 0   cephALEXin  (KEFLEX ) 500 MG capsule SMARTSIG:4 Capsule(s) By Mouth Once     Dextromethorphan-guaiFENesin 10-100 MG/5ML liquid Take 5 mLs by mouth every 12 (twelve) hours.     dorzolamide (TRUSOPT) 2 % ophthalmic solution Place 1 drop into both eyes 3 (three) times daily.     fluticasone  (FLONASE ) 50 MCG/ACT nasal spray fluticasone  propionate 50 mcg/actuation nasal spray,suspension     glipiZIDE  (GLUCOTROL ) 5 MG tablet Take 2.5 mg by mouth daily.     Lancets (ONETOUCH DELICA PLUS  LANCET33G) MISC      latanoprost (XALATAN) 0.005 % ophthalmic solution Place into both eyes at bedtime.     levofloxacin (LEVAQUIN) 500 MG tablet Take 500 mg by mouth daily.     omeprazole  (PRILOSEC) 20 MG capsule Take 1 capsule (20 mg total) by mouth daily. 60 capsule 3   ondansetron  (ZOFRAN ) 4 MG tablet Take 4-8 mg by mouth every 6 (six) hours.     ONE TOUCH ULTRA TEST test strip      pantoprazole  (PROTONIX ) 40 MG tablet SMARTSIG:1 Tablet(s) By Mouth     polyethylene glycol (MIRALAX  / GLYCOLAX ) 17 g packet Take 17 g by mouth 2 (two) times daily. 180 packet 0   Semaglutide (RYBELSUS) 3 MG TABS Take 3 mg by mouth daily. (Patient not taking: Reported on 06/17/2024)     No current facility-administered medications for this visit.    Review of Systems: GENERAL: negative for malaise, night sweats HEENT: No changes in hearing or vision, no nose bleeds or other nasal problems. NECK: Negative for lumps, goiter, pain and significant neck swelling RESPIRATORY: Negative for cough, wheezing CARDIOVASCULAR: Negative for chest pain, leg swelling, palpitations, orthopnea GI: SEE HPI MUSCULOSKELETAL: Negative for joint pain or swelling, back pain, and muscle pain. SKIN: Negative for lesions, rash HEMATOLOGY Negative for prolonged bleeding, bruising easily, and swollen nodes. ENDOCRINE: Negative for cold or heat intolerance, polyuria, polydipsia and goiter. NEURO: negative for tremor, gait imbalance, syncope and seizures. The remainder of the review of systems is noncontributory.   Physical Exam: BP (!) 143/62   Pulse 64   Temp 98.1 F (36.7 C)   Ht 5' 4 (1.626 m)   Wt 214 lb 4.8 oz (97.2 kg)   BMI 36.78 kg/m  GENERAL: The patient is AO x3, in no acute distress. HEENT: Head is normocephalic and atraumatic. EOMI are intact. Mouth is well hydrated and without lesions. NECK: Supple. No masses LUNGS: Clear to auscultation. No presence of rhonchi/wheezing/rales. Adequate chest expansion HEART:  RRR, normal s1 and s2. ABDOMEN: Soft, epigastric and LLQ tenderness, no guarding, no peritoneal signs, and nondistended. BS +. No masses.   Imaging/Labs: as above     Latest Ref Rng & Units 08/15/2023   10:22 PM 01/30/2015    3:00 PM 03/11/2014    3:38 PM  CBC  WBC 4.0 - 10.5 K/uL 7.9  10.5  4.9   Hemoglobin 12.0 - 15.0 g/dL 87.1  86.2  87.2   Hematocrit 36.0 - 46.0 % 38.7  40.6  36.7   Platelets 150 - 400 K/uL 263  229  232    No results found for: IRON, TIBC, FERRITIN  I personally reviewed and interpreted the available labs, imaging and endoscopic files.  CT 06/2024  IMPRESSION: 1. No acute intra-abdominal or pelvic pathology. 2. Severe distal colonic diverticulosis. No bowel obstruction. Normal appendix. 3.  Aortic Atherosclerosis (ICD10-I70.0).  Impression and Plan:  Nancy [name] is a 63 y.o. female with diabetes  on Rybelsus and hypertension who presents for evaluation of Abdominal pain, altered bowel movement, diarrhea, screening colonoscopy  #Epigastric tenderness #LLQ abdomen tenderness #Altered Bowel movements  On exam today patient is epigastric and left lower quadrant abdominal tenderness.  Epigastric pain could be peptic ulcer disease as the pain was radiating to the left upper quadrant  On my CT scan review there is evidence of significant stool burden throughout the colon specially left lower quadrant  This could be symptomatic diverticular disease with underlying constipation.  No overt diverticulitis seen on recent CT scan but patient has severe diverticulosis  Will obtain abdominal ultrasound to evaluate for cholelithiasis as at times is a better study than CT to eval for cholelithiasis  Advised patient MiraLAX  twice daily  Ensure adequate fluid intake: Aim for 8 glasses of water daily. Follow a high fiber diet: Include foods such as dates, prunes, pears Upper endoscopy with biopsies to evaluate for peptic ulcer disease given epigastric tenderness   PPI daily 30 minutes before breakfast  #Screening colonoscopy  The patient was counseled regarding the importance of colorectal cancer screening,The benefits of screening include early detection of colorectal cancer and precancerous polyps, which can improve treatment outcomes and reduce mortality. Risks associated with screening, particularly colonoscopy, include potential complications such as bleeding and perforation. 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      Anaysha Andre Faizan Ezmeralda Stefanick, MD Gastroenterology and Hepatology [facility] Gastroenterology   This chart has been completed using [facility] Dictation software, and while attempts have been made to ensure accuracy , certain words and phrases may not be transcribed as intended

## 2024-06-17 NOTE — Telephone Encounter (Signed)
 Spoke with Hoy at [facility]. She was wanting to make sure pt is to be on Omeprazole  20 mg instead of Pantoprazole  40 mg that was prescribed by Dr.Fusco. Myles Hoy that our provider does want her to be on Omeprazole  20 mg daily. Hoy states she will d/c the Pantoprazole  40 mg and inform pt to not take the Pantoprazole .

## 2024-06-17 NOTE — Telephone Encounter (Signed)
Omeprazole 20 mg 

## 2024-06-17 NOTE — Patient Instructions (Addendum)
 It was very nice to meet you today, as dicussed with will plan for the following :  1)Ensure adequate fluid intake: Aim for 8 glasses of water daily. Follow a high fiber diet: Include foods such as dates, prunes, pears Use Miralax  twice a day.  2) Omeprazole  20mg  , 30 min before breakfast  3) blood work and ultrasound  4) Upper endoscopy and colonoscopy

## 2024-06-17 NOTE — Telephone Encounter (Signed)
 Noted. Pharmacy aware to fill Omeprazole  20 mg

## 2024-06-17 NOTE — Telephone Encounter (Signed)
 Please call [facility] regarding prescription that was sent in this morning on patient. They need some clarification.

## 2024-06-17 NOTE — Addendum Note (Signed)
 Addended by: Kathee Tumlin on: 06/17/2024 02:02 PM   Modules accepted: Orders

## 2024-06-18 LAB — HEPATITIS C ANTIBODY: Hep C Virus Ab: NONREACTIVE

## 2024-06-18 LAB — IRON,TIBC AND FERRITIN PANEL
Ferritin: 328 ng/mL — ABNORMAL HIGH (ref 15–150)
Iron Saturation: 15 % (ref 15–55)
Iron: 39 ug/dL (ref 27–139)
Total Iron Binding Capacity: 266 ug/dL (ref 250–450)
UIBC: 227 ug/dL (ref 118–369)

## 2024-06-18 LAB — ANA: ANA Titer 1: NEGATIVE

## 2024-06-18 LAB — ANTI-SMOOTH MUSCLE ANTIBODY, IGG: Smooth Muscle Ab: 11 U (ref 0–19)

## 2024-06-18 LAB — HIV ANTIBODY (ROUTINE TESTING W REFLEX): HIV Screen 4th Generation wRfx: NONREACTIVE

## 2024-06-18 LAB — HEPATITIS B CORE ANTIBODY, TOTAL: Hep B Core Total Ab: NEGATIVE

## 2024-06-18 LAB — HEPATITIS A ANTIBODY, TOTAL: hep A Total Ab: NEGATIVE

## 2024-06-18 LAB — HEPATITIS B SURFACE ANTIGEN: Hepatitis B Surface Ag: NEGATIVE

## 2024-06-18 LAB — PROTIME-INR
INR: 1 (ref 0.9–1.2)
Prothrombin Time: 11.3 s (ref 9.1–12.0)

## 2024-06-18 LAB — HEPATITIS B SURFACE ANTIBODY,QUALITATIVE: Hep B Surface Ab, Qual: REACTIVE

## 2024-06-21 ENCOUNTER — Ambulatory Visit (INDEPENDENT_AMBULATORY_CARE_PROVIDER_SITE_OTHER): Payer: Self-pay | Admitting: Gastroenterology

## 2024-06-21 NOTE — Progress Notes (Signed)
 Labs for elevated liver enzymes  Ferritin 328 iron saturation 15 ANA negative, ASMA negative INR 1.0  Hepatitis A nonimmune Hepatitis B core antibody negative Hepatitis B antibody positive Hepatitis B surface antigen negative Hepatitis C negative HIV negative

## 2024-06-22 ENCOUNTER — Ambulatory Visit (INDEPENDENT_AMBULATORY_CARE_PROVIDER_SITE_OTHER): Admitting: Gastroenterology

## 2024-06-25 ENCOUNTER — Ambulatory Visit (HOSPITAL_COMMUNITY)
Admission: RE | Admit: 2024-06-25 | Discharge: 2024-06-25 | Disposition: A | Discharge status: Home / Self Care | Source: Ambulatory Visit | Attending: Gastroenterology | Admitting: Gastroenterology

## 2024-06-25 DIAGNOSIS — K760 Fatty (change of) liver, not elsewhere classified: Secondary | ICD-10-CM | POA: Insufficient documentation

## 2024-06-25 DIAGNOSIS — R748 Abnormal levels of other serum enzymes: Secondary | ICD-10-CM | POA: Diagnosis present

## 2024-07-10 ENCOUNTER — Telehealth: Payer: Self-pay

## 2024-07-10 NOTE — Telephone Encounter (Signed)
 Copied from CRM #[id number]. Topic: Clinical - Medical Advice >> Jul 10, 2024 12:41 PM Harlene ORN wrote: Reason for CRM: wants to know if her medical records from her previous doctor's office have been transferred to [facility] for her upcoming appointment for 08/22. Please call back to confirm with patient.

## 2024-07-13 ENCOUNTER — Encounter (INDEPENDENT_AMBULATORY_CARE_PROVIDER_SITE_OTHER): Payer: Self-pay

## 2024-07-14 ENCOUNTER — Encounter (HOSPITAL_COMMUNITY): Payer: Self-pay

## 2024-07-14 ENCOUNTER — Encounter (HOSPITAL_COMMUNITY)
Admission: RE | Admit: 2024-07-14 | Discharge: 2024-07-14 | Disposition: A | Discharge status: Home / Self Care | Source: Ambulatory Visit | Attending: Gastroenterology | Admitting: Gastroenterology

## 2024-07-14 NOTE — Patient Instructions (Addendum)
 Nancy [name]  07/14/2024     @PREFPERIOPPHARMACY @   Your procedure is scheduled on 07/16/2024.   Report to [facility] at 12:15 PM   Call this number if you have problems the morning of surgery:  [phone]  If you experience any cold or flu symptoms such as cough, fever, chills, shortness of breath, etc. between now and your scheduled surgery, please notify us  at the above number.   Remember:   Please follow the diet and prep instructions given to you by Dr Orion office.     You may drink clear liquids until 10:15AM .  Clear liquids allowed are:                    Water , Juice (No red color; non-citric and without pulp; diabetics please choose diet or no sugar options), Carbonated beverages (diabetics please choose diet or no sugar options), Clear Tea (No creamer, milk, or cream, including half & half and powdered creamer), Black Coffee Only (No creamer, milk or cream, including half & half and powdered creamer), Plain Jell-O Only (No red color; diabetics please choose no sugar options), Clear Sports drink (No red color; diabetics please choose diet or no sugar options), and Plain Popsicles Only (No red color; diabetics please choose no sugar options)    Take these medicines the morning of surgery with A SIP OF WATER  : Prilosec Protonix     Last dose of Rybelsus should be on 07/12/2024  Do not take any diabetic meds the am of the procedure.     Do not wear jewelry, make-up or nail polish, including gel polish,  artificial nails, or any other type of covering on natural nails (fingers and  toes).  Do not wear lotions, powders, or perfumes, or deodorant.  Do not shave 48 hours prior to surgery.  Men may shave face and neck.  Do not bring valuables to the hospital.  [facility] is not responsible for any belongings or valuables.  Contacts, dentures or bridgework may not be worn into surgery.  Leave your suitcase in the car.  After surgery it may be brought to your room.  For  patients admitted to the hospital, discharge time will be determined by your treatment team.  Patients discharged the day of surgery will not be allowed to drive home.   Name and phone number of your driver:   Family  Special instructions:  N/A  Please read over the following fact sheets that you were given.  Care and Recovery After Surgery   Colonoscopy, Adult A colonoscopy is a procedure to look at the entire large intestine. This procedure is done using a long, thin, flexible tube that has a camera on the end. You may have a colonoscopy: As a part of normal colorectal screening. If you have certain symptoms, such as: A low number of red blood cells in your blood (anemia). Diarrhea that does not go away. Pain in your abdomen. Blood in your stool. A colonoscopy can help screen for and diagnose medical problems, including: An abnormal growth of cells or tissue (tumor). Abnormal growths within the lining of your intestine (polyps). Inflammation. Areas of bleeding. Tell your health care provider about: Any allergies you have. All medicines you are taking, including vitamins, herbs, eye drops, creams, and over-the-counter medicines. Any problems you or family members have had with anesthetic medicines. Any bleeding problems you have. Any surgeries you have had. Any medical conditions you have. Any problems you have had  with having bowel movements. Whether you are pregnant or may be pregnant. What are the risks? Generally, this is a safe procedure. However, problems may occur, including: Bleeding. Damage to your intestine. Allergic reactions to medicines given during the procedure. Infection. This is rare. What happens before the procedure? Eating and drinking restrictions Follow instructions from your health care provider about eating or drinking restrictions, which may include: A few days before the procedure: Follow a low-fiber diet. Avoid nuts, seeds, dried fruit, raw  fruits, and vegetables. 1-3 days before the procedure: Eat only gelatin dessert or ice pops. Drink only clear liquids, such as water , clear juice, clear broth or bouillon, black coffee or tea, or clear soft drinks or sports drinks. Avoid liquids that contain red or purple dye. The day of the procedure: Do not eat solid foods. You may continue to drink clear liquids until up to 2 hours before the procedure. Do not eat or drink anything starting 2 hours before the procedure, or within the time period that your health care provider recommends. Bowel prep If you were prescribed a bowel prep to take by mouth (orally) to clean out your colon: Take it as told by your health care provider. Starting the day before your procedure, you will need to drink a large amount of liquid medicine. The liquid will cause you to have many bowel movements of loose stool until your stool becomes almost clear or light green. If your skin or the opening between the buttocks (anus) gets irritated from diarrhea, you may relieve the irritation using: Wipes with medicine in them, such as adult wet wipes with aloe and vitamin E. A product to soothe skin, such as petroleum jelly. If you vomit while drinking the bowel prep: Take a break for up to 60 minutes. Begin the bowel prep again. Call your health care provider if you keep vomiting or you cannot take the bowel prep without vomiting. To clean out your colon, you may also be given: Laxative medicines. These help you have a bowel movement. Instructions for enema use. An enema is liquid medicine injected into your rectum. Medicines Ask your health care provider about: Changing or stopping your regular medicines or supplements. This is especially important if you are taking iron supplements, diabetes medicines, or blood thinners. Taking medicines such as aspirin and ibuprofen. These medicines can thin your blood. Do not take these medicines unless your health care provider  tells you to take them. Taking over-the-counter medicines, vitamins, herbs, and supplements. General instructions Ask your health care provider what steps will be taken to help prevent infection. These may include washing skin with a germ-killing soap. If you will be going home right after the procedure, plan to have a responsible adult: Take you home from the hospital or clinic. You will not be allowed to drive. Care for you for the time you are told. What happens during the procedure?  An IV will be inserted into one of your veins. You will be given a medicine to make you fall asleep (general anesthetic). You will lie on your side with your knees bent. A lubricant will be put on the tube. Then the tube will be: Inserted into your anus. Gently eased through all parts of your large intestine. Air will be sent into your colon to keep it open. This may cause some pressure or cramping. Images will be taken with the camera and will appear on a screen. A small tissue sample may be removed to be looked at  under a microscope (biopsy). The tissue may be sent to a lab for testing if any signs of problems are found. If small polyps are found, they may be removed and checked for cancer cells. When the procedure is finished, the tube will be removed. The procedure may vary among health care providers and hospitals. What happens after the procedure? Your blood pressure, heart rate, breathing rate, and blood oxygen level will be monitored until you leave the hospital or clinic. You may have a small amount of blood in your stool. You may pass gas and have mild cramping or bloating in your abdomen. This is caused by the air that was used to open your colon during the exam. If you were given a sedative during the procedure, it can affect you for several hours. Do not drive or operate machinery until your health care provider says that it is safe. It is up to you to get the results of your procedure. Ask  your health care provider, or the department that is doing the procedure, when your results will be ready. Summary A colonoscopy is a procedure to look at the entire large intestine. Follow instructions from your health care provider about eating and drinking before the procedure. If you were prescribed an oral bowel prep to clean out your colon, take it as told by your health care provider. During the colonoscopy, a flexible tube with a camera on its end is inserted into the anus and then passed into all parts of the large intestine. This information is not intended to replace advice given to you by your health care provider. Make sure you discuss any questions you have with your health care provider. Document Revised: 01/08/2023 Document Reviewed: 07/19/2021 Elsevier Patient Education  2024 [employer]  Upper Endoscopy, Adult Upper endoscopy is a procedure to look inside the upper GI (gastrointestinal) tract. The upper GI tract is made up of: The esophagus. This is the part of the body that moves food from your mouth to your stomach. The stomach. The duodenum. This is the first part of your small intestine. This procedure is also called esophagogastroduodenoscopy (EGD) or gastroscopy. In this procedure, your health care provider passes a thin, flexible tube (endoscope) through your mouth and down your esophagus into your stomach and into your duodenum. A small camera is attached to the end of the tube. Images from the camera appear on a monitor in the exam room. During this procedure, your health care provider may also remove a small piece of tissue to be sent to a lab and examined under a microscope (biopsy). Your health care provider may do an upper endoscopy to diagnose cancers of the upper GI tract. You may also have this procedure to find the cause of other conditions, such as: Stomach pain. Heartburn. Pain or problems when swallowing. Nausea and vomiting. Stomach bleeding. Stomach  ulcers. Tell a health care provider about: Any allergies you have. All medicines you are taking, including vitamins, herbs, eye drops, creams, and over-the-counter medicines. Any problems you or family members have had with anesthetic medicines. Any bleeding problems you have. Any surgeries you have had. Any medical conditions you have. Whether you are pregnant or may be pregnant. What are the risks? Your healthcare provider will talk with you about risks. These may include: Infection. Bleeding. Allergic reactions to medicines. A tear or hole (perforation) in the esophagus, stomach, or duodenum. What happens before the procedure? When to stop eating and drinking Follow instructions from your health  care provider about what you may eat and drink. These may include: 8 hours before your procedure Stop eating most foods. Do not eat meat, fried foods, or fatty foods. Eat only light foods, such as toast or crackers. All liquids are okay except energy drinks and alcohol. 6 hours before your procedure Stop eating. Drink only clear liquids, such as water , clear fruit juice, black coffee, plain tea, and sports drinks. Do not drink energy drinks or alcohol. 2 hours before your procedure Stop drinking all liquids. You may be allowed to take medicines with small sips of water . If you do not follow your health care provider's instructions, your procedure may be delayed or canceled. Medicines Ask your health care provider about: Changing or stopping your regular medicines. This is especially important if you are taking diabetes medicines or blood thinners. Taking medicines such as aspirin and ibuprofen. These medicines can thin your blood. Do not take these medicines unless your health care provider tells you to take them. Taking over-the-counter medicines, vitamins, herbs, and supplements. General instructions If you will be going home right after the procedure, plan to have a responsible  adult: Take you home from the hospital or clinic. You will not be allowed to drive. Care for you for the time you are told. What happens during the procedure?  An IV will be inserted into one of your veins. You may be given one or more of the following: A medicine to help you relax (sedative). A medicine to numb the throat (local anesthetic). You will lie on your left side on an exam table. Your health care provider will pass the endoscope through your mouth and down your esophagus. Your health care provider will use the scope to check the inside of your esophagus, stomach, and duodenum. Biopsies may be taken. The endoscope will be removed. The procedure may vary among health care providers and hospitals. What happens after the procedure? Your blood pressure, heart rate, breathing rate, and blood oxygen level will be monitored until you leave the hospital or clinic. When your throat is no longer numb, you may be given some fluids to drink. If you were given a sedative during the procedure, it can affect you for several hours. Do not drive or operate machinery until your health care provider says that it is safe. It is up to you to get the results of your procedure. Ask your health care provider, or the department that is doing the procedure, when your results will be ready. Contact a health care provider if you: Have a sore throat that lasts longer than 1 day. Have a fever. Get help right away if you: Vomit blood or your vomit looks like coffee grounds. Have bloody, black, or tarry stools. Have a very bad sore throat or you cannot swallow. Have difficulty breathing or very bad pain in your chest or abdomen. These symptoms may be an emergency. Get help right away. Call 911. Do not wait to see if the symptoms will go away. Do not drive yourself to the hospital. Summary Upper endoscopy is a procedure to look inside the upper GI tract. During the procedure, an IV will be inserted into one  of your veins. You may be given a medicine to help you relax. The endoscope will be passed through your mouth and down your esophagus. Follow instructions from your health care provider about what you can eat and drink. This information is not intended to replace advice given to you by your health care  provider. Make sure you discuss any questions you have with your health care provider. Document Revised: 03/07/2022 Document Reviewed: 03/07/2022 Elsevier Patient Education  2024 [employer]    Monitored Anesthesia Care Anesthesia refers to the techniques, procedures, and medicines that help a person stay safe and comfortable during surgery. Monitored anesthesia care, or sedation, is one type of anesthesia. You may have sedation if you do not need to be asleep for your procedure. Procedures that use sedation may include: Surgery to remove cataracts from your eyes. A dental procedure. A biopsy. This is when a tissue sample is removed and looked at under a microscope. You will be watched closely during your procedure. Your level of sedation or type of anesthesia may be changed to fit your needs. Tell a health care provider about: Any allergies you have. All medicines you are taking, including vitamins, herbs, eye drops, creams, and over-the-counter medicines. Any problems you or family members have had with anesthesia. Any bleeding problems you have. Any surgeries you have had. Any medical conditions or illnesses you have. This includes sleep apnea, cough, fever, or the flu. Whether you are pregnant or may be pregnant. Whether you use cigarettes, alcohol, or drugs. Any use of steroids, whether by mouth or as a cream. What are the risks? Your health care provider will talk with you about risks. These may include: Getting too much medicine (oversedation). Nausea. Allergic reactions to medicines. Trouble breathing. If this happens, a breathing tube may be used to help you breathe. It will be  removed when you are awake and breathing on your own. Heart trouble. Lung trouble. Confusion that gets better with time (emergence delirium). What happens before the procedure? When to stop eating and drinking Follow instructions from your health care provider about what you may eat and drink. These may include: 8 hours before your procedure Stop eating most foods. Do not eat meat, fried foods, or fatty foods. Eat only light foods, such as toast or crackers. All liquids are okay except energy drinks and alcohol. 6 hours before your procedure Stop eating. Drink only clear liquids, such as water , clear fruit juice, black coffee, plain tea, and sports drinks. Do not drink energy drinks or alcohol. 2 hours before your procedure Stop drinking all liquids. You may be allowed to take medicines with small sips of water . If you do not follow your health care provider's instructions, your procedure may be delayed or canceled. Medicines Ask your health care provider about: Changing or stopping your regular medicines. These include any diabetes medicines or blood thinners you take. Taking medicines such as aspirin and ibuprofen. These medicines can thin your blood. Do not take them unless your health care provider tells you to. Taking over-the-counter medicines, vitamins, herbs, and supplements. Testing You may have an exam or testing. You may have a blood or urine sample taken. General instructions Do not use any products that contain nicotine or tobacco for at least 4 weeks before the procedure. These products include cigarettes, chewing tobacco, and vaping devices, such as e-cigarettes. If you need help quitting, ask your health care provider. If you will be going home right after the procedure, plan to have a responsible adult: Take you home from the hospital or clinic. You will not be allowed to drive. Care for you for the time you are told. What happens during the procedure?  Your blood  pressure, heart rate, breathing, level of pain, and blood oxygen level will be monitored. An IV will be inserted  into one of your veins. You may be given: A sedative. This helps you relax. Anesthesia. This will: Numb certain areas of your body. Make you fall asleep for surgery. You will be given medicines as needed to keep you comfortable. The more medicine you are given, the deeper your level of sedation will be. Your level of sedation may be changed to fit your needs. There are three levels of sedation: Mild sedation. At this level, you may feel awake and relaxed. You will be able to follow directions. Moderate sedation. At this level, you will be sleepy. You may not remember the procedure. Deep sedation. At this level, you will be asleep. You will not remember the procedure. How you get the medicines will depend on your age and the procedure. They may be given as: A pill. This may be taken by mouth (orally) or inserted into the rectum. An injection. This may be into a vein or muscle. A spray through the nose. After your procedure is over, the medicine will be stopped. The procedure may vary among health care providers and hospitals. What happens after the procedure? Your blood pressure, heart rate, breathing rate, and blood oxygen level will be monitored until you leave the hospital or clinic. You may feel sleepy, clumsy, or nauseous. You may not remember what happened during or after the procedure. Sedation can affect you for several hours. Do not drive or use machinery until your health care provider says that it is safe. This information is not intended to replace advice given to you by your health care provider. Make sure you discuss any questions you have with your health care provider. Document Revised: 04/23/2022 Document Reviewed: 04/23/2022 Elsevier Patient Education  2024 [employer].

## 2024-07-14 NOTE — Progress Notes (Signed)
 Patient did not stop Rybelsus as instructed.  We will move her to the last case of the day and administer Reglan  10 mg IV pre procedure.

## 2024-07-14 NOTE — Progress Notes (Signed)
 Attempted to contact pt regarding her PAT. No answer. Left VM

## 2024-07-14 NOTE — Telephone Encounter (Signed)
 Sent mychart message that records are scanned in her chart from [facility]

## 2024-07-16 ENCOUNTER — Encounter (HOSPITAL_COMMUNITY): Payer: Self-pay | Admitting: Gastroenterology

## 2024-07-16 ENCOUNTER — Other Ambulatory Visit: Payer: Self-pay

## 2024-07-16 ENCOUNTER — Ambulatory Visit (HOSPITAL_COMMUNITY)

## 2024-07-16 ENCOUNTER — Encounter (HOSPITAL_COMMUNITY)
Admission: RE | Disposition: A | Discharge status: Home / Self Care | Payer: Self-pay | Source: Home / Self Care | Attending: Gastroenterology

## 2024-07-16 ENCOUNTER — Ambulatory Visit (HOSPITAL_COMMUNITY)
Admission: RE | Admit: 2024-07-16 | Discharge: 2024-07-16 | Disposition: A | Discharge status: Home / Self Care | Attending: Gastroenterology | Admitting: Gastroenterology

## 2024-07-16 ENCOUNTER — Ambulatory Visit (HOSPITAL_BASED_OUTPATIENT_CLINIC_OR_DEPARTMENT_OTHER)

## 2024-07-16 DIAGNOSIS — D123 Benign neoplasm of transverse colon: Secondary | ICD-10-CM | POA: Insufficient documentation

## 2024-07-16 DIAGNOSIS — K317 Polyp of stomach and duodenum: Secondary | ICD-10-CM

## 2024-07-16 DIAGNOSIS — I10 Essential (primary) hypertension: Secondary | ICD-10-CM | POA: Diagnosis not present

## 2024-07-16 DIAGNOSIS — K2970 Gastritis, unspecified, without bleeding: Secondary | ICD-10-CM

## 2024-07-16 DIAGNOSIS — K2950 Unspecified chronic gastritis without bleeding: Secondary | ICD-10-CM | POA: Diagnosis not present

## 2024-07-16 DIAGNOSIS — K648 Other hemorrhoids: Secondary | ICD-10-CM

## 2024-07-16 DIAGNOSIS — K5140 Inflammatory polyps of colon without complications: Secondary | ICD-10-CM

## 2024-07-16 DIAGNOSIS — D122 Benign neoplasm of ascending colon: Secondary | ICD-10-CM

## 2024-07-16 DIAGNOSIS — K449 Diaphragmatic hernia without obstruction or gangrene: Secondary | ICD-10-CM

## 2024-07-16 DIAGNOSIS — K5730 Diverticulosis of large intestine without perforation or abscess without bleeding: Secondary | ICD-10-CM

## 2024-07-16 DIAGNOSIS — D120 Benign neoplasm of cecum: Secondary | ICD-10-CM | POA: Diagnosis not present

## 2024-07-16 DIAGNOSIS — J45909 Unspecified asthma, uncomplicated: Secondary | ICD-10-CM | POA: Diagnosis not present

## 2024-07-16 DIAGNOSIS — Z7984 Long term (current) use of oral hypoglycemic drugs: Secondary | ICD-10-CM | POA: Diagnosis not present

## 2024-07-16 DIAGNOSIS — K3189 Other diseases of stomach and duodenum: Secondary | ICD-10-CM

## 2024-07-16 DIAGNOSIS — K635 Polyp of colon: Secondary | ICD-10-CM

## 2024-07-16 DIAGNOSIS — E119 Type 2 diabetes mellitus without complications: Secondary | ICD-10-CM | POA: Diagnosis not present

## 2024-07-16 DIAGNOSIS — Z1211 Encounter for screening for malignant neoplasm of colon: Secondary | ICD-10-CM

## 2024-07-16 DIAGNOSIS — I890 Lymphedema, not elsewhere classified: Secondary | ICD-10-CM | POA: Insufficient documentation

## 2024-07-16 LAB — GLUCOSE, CAPILLARY
Glucose-Capillary: 65 mg/dL — ABNORMAL LOW (ref 70–99)
Glucose-Capillary: 117 mg/dL — ABNORMAL HIGH (ref 70–99)

## 2024-07-16 SURGERY — COLONOSCOPY
Anesthesia: General

## 2024-07-16 MED ORDER — DEXMEDETOMIDINE HCL IN NACL 80 MCG/20ML IV SOLN
INTRAVENOUS | Status: DC | PRN
  Administered 2024-07-16 (×2): 10 ug via INTRAVENOUS

## 2024-07-16 MED ORDER — STERILE WATER FOR IRRIGATION IR SOLN
Status: DC | PRN
  Administered 2024-07-16: 60 mL

## 2024-07-16 MED ORDER — LIDOCAINE 2% (20 MG/ML) 5 ML SYRINGE
INTRAMUSCULAR | Status: DC | PRN
  Administered 2024-07-16: 100 mg via INTRAVENOUS

## 2024-07-16 MED ORDER — METOCLOPRAMIDE HCL 5 MG/ML IJ SOLN
10.0000 mg | Freq: Once | INTRAMUSCULAR | Status: AC
  Administered 2024-07-16: 10 mg via INTRAVENOUS
  Filled 2024-07-16: qty 2

## 2024-07-16 MED ORDER — PROPOFOL 500 MG/50ML IV EMUL
INTRAVENOUS | Status: DC | PRN
Start: 2024-07-16 — End: 2024-07-16
  Administered 2024-07-16: 150 ug/kg/min via INTRAVENOUS

## 2024-07-16 MED ORDER — LACTATED RINGERS IV SOLN: INTRAVENOUS | Status: DC

## 2024-07-16 MED ORDER — DEXTROSE 50 % IV SOLN
INTRAVENOUS | Status: AC
  Administered 2024-07-16: 25 mL
  Filled 2024-07-16: qty 50

## 2024-07-16 MED ORDER — PROPOFOL 10 MG/ML IV BOLUS
INTRAVENOUS | Status: DC | PRN
  Administered 2024-07-16: 50 mg via INTRAVENOUS

## 2024-07-16 NOTE — Transfer of Care (Signed)
 Immediate Anesthesia Transfer of Care Note  Patient: MARTENA EMANUELE  Procedure(s) Performed: COLONOSCOPY EGD (ESOPHAGOGASTRODUODENOSCOPY)  Patient Location: Short Stay  Anesthesia Type:General  Level of Consciousness: drowsy and patient cooperative  Airway & Oxygen Therapy: Patient Spontanous Breathing  Post-op Assessment: Report given to RN and Post -op Vital signs reviewed and stable  Post vital signs: Reviewed and stable  Last Vitals:  Vitals Value Taken Time  BP 136/81 07/16/24 15:44  Temp 36.4 C 07/16/24 15:44  Pulse 68 07/16/24 15:44  Resp 15 07/16/24 15:44  SpO2 100 % 07/16/24 15:44    Last Pain:  Vitals:   07/16/24 1544  TempSrc: Axillary  PainSc: 0-No pain         Complications: No notable events documented.

## 2024-07-16 NOTE — Op Note (Signed)
 [facility] Patient Name: Nancy [name] Procedure Date: 07/16/2024 1:42 PM MRN: [id number] Date of Birth: 1961/10/05 Attending MD: Deatrice Dine , MD, [id number] CSN: [id number] Age: 63 Admit Type: Outpatient Procedure:                Upper GI endoscopy Indications:              Epigastric abdominal pain Providers:                Deatrice Dine, MD, Devere Lodge, Jon Loge Referring MD:             Deatrice Dine, MD Medicines:                Monitored Anesthesia Care Complications:            No immediate complications. Estimated Blood Loss:     Estimated blood loss: none. Procedure:                Pre-Anesthesia Assessment:                           - Prior to the procedure, a History and Physical                            was performed, and patient medications and                            allergies were reviewed. The patient's tolerance of                            previous anesthesia was also reviewed. The risks                            and benefits of the procedure and the sedation                            options and risks were discussed with the patient.                            All questions were answered, and informed consent                            was obtained. Prior Anticoagulants: The patient has                            taken no anticoagulant or antiplatelet agents. ASA                            Grade Assessment: II - A patient with mild systemic                            disease. After reviewing the risks and benefits,                            the patient was deemed in satisfactory condition to  undergo the procedure.                           After obtaining informed consent, the endoscope was                            passed under direct vision. Throughout the                            procedure, the patient's blood pressure, pulse, and                            oxygen saturations were monitored continuously.  The                            [id number]-[id number] ([id number]) scope was introduced through the                            mouth, and advanced to the second part of duodenum.                            The upper GI endoscopy was accomplished without                            difficulty. The patient tolerated the procedure                            well. Scope In: 2:59:31 PM Scope Out: 3:09:20 PM Total Procedure Duration: 0 hours 9 minutes 49 seconds  Findings:      The examined esophagus was normal.      A 2 cm hiatal hernia was present.      Three 3 to 6 mm sessile polyps with no stigmata of recent bleeding were       found in the gastric fundus and in the gastric body. The polyp was       removed with a cold snare. Resection and retrieval were complete.      Mildly erythematous mucosa without bleeding was found in the gastric       antrum. This was biopsied with a cold forceps for histology.      Localized mucosal variance characterized by white specks was found in       the second portion of the duodenum. Biopsies were taken with a cold       forceps for histology. Impression:               - Normal esophagus.                           - 2 cm hiatal hernia.                           - Three gastric polyps. Resected and retrieved.                           - Erythematous mucosa in the antrum. Biopsied.                           -  Mucosal variant in the duodenum. Biopsied. Moderate Sedation:      Per Anesthesia Care Recommendation:           - Patient has a contact number available for                            emergencies. The signs and symptoms of potential                            delayed complications were discussed with the                            patient. Return to normal activities tomorrow.                            Written discharge instructions were provided to the                            patient.                           - Resume previous diet.                            - Continue present medications.                           - Await pathology results. Procedure Code(s):        --- Professional ---                           [phone], Esophagogastroduodenoscopy, flexible,                            transoral; with removal of tumor(s), polyp(s), or                            other lesion(s) by snare technique                           43239, 59, Esophagogastroduodenoscopy, flexible,                            transoral; with biopsy, single or multiple Diagnosis Code(s):        --- Professional ---                           K44.9, Diaphragmatic hernia without obstruction or                            gangrene                           K31.7, Polyp of stomach and duodenum                           K31.89, Other diseases of stomach and duodenum  R10.13, Epigastric pain CPT copyright 2022 American Medical Association. All rights reserved. The codes documented in this report are preliminary and upon coder review may  be revised to meet current compliance requirements. Deatrice Dine, MD Deatrice Dine, MD 07/16/2024 3:45:13 PM This report has been signed electronically. Number of Addenda: 0

## 2024-07-16 NOTE — Discharge Instructions (Signed)

## 2024-07-16 NOTE — Anesthesia Postprocedure Evaluation (Signed)
 Anesthesia Post Note  Patient: Nancy [name]  Procedure(s) Performed: COLONOSCOPY EGD (ESOPHAGOGASTRODUODENOSCOPY)  Patient location during evaluation: PACU Anesthesia Type: General Level of consciousness: awake and alert Pain management: pain level controlled Vital Signs Assessment: post-procedure vital signs reviewed and stable Respiratory status: spontaneous breathing, nonlabored ventilation, respiratory function stable and patient connected to nasal cannula oxygen Cardiovascular status: blood pressure returned to baseline and stable Postop Assessment: no apparent nausea or vomiting Anesthetic complications: no   No notable events documented.   Last Vitals:  Vitals:   07/16/24 1331 07/16/24 1544  BP: (!) 122/53 136/81  Pulse: 72 68  Resp: 20 15  Temp: 36.9 C (!) 36.4 C  SpO2: 98% 100%    Last Pain:  Vitals:   07/16/24 1544  TempSrc: Axillary  PainSc: 0-No pain                 Andrea Limes

## 2024-07-16 NOTE — Op Note (Signed)
 [facility] Patient Name: Nancy [name] Procedure Date: 07/16/2024 1:39 PM MRN: [id number] Date of Birth: 12/03/1961 Attending MD: Deatrice Dine , MD, [id number] CSN: [id number] Age: 63 Admit Type: Outpatient Procedure:                Colonoscopy Indications:              Screening for colorectal malignant neoplasm Providers:                Deatrice Dine, MD, Devere Lodge, Jon Loge Referring MD:             Deatrice Dine, MD Medicines:                Monitored Anesthesia Care Complications:            No immediate complications. Estimated Blood Loss:     Estimated blood loss was minimal. Procedure:                Pre-Anesthesia Assessment:                           - Prior to the procedure, a History and Physical                            was performed, and patient medications and                            allergies were reviewed. The patient's tolerance of                            previous anesthesia was also reviewed. The risks                            and benefits of the procedure and the sedation                            options and risks were discussed with the patient.                            All questions were answered, and informed consent                            was obtained. Prior Anticoagulants: The patient has                            taken no anticoagulant or antiplatelet agents. ASA                            Grade Assessment: II - A patient with mild systemic                            disease. After reviewing the risks and benefits,                            the patient was deemed in satisfactory condition to  undergo the procedure.                           After obtaining informed consent, the colonoscope                            was passed under direct vision. Throughout the                            procedure, the patient's blood pressure, pulse, and                            oxygen saturations were  monitored continuously. The                            [id number]-[id number] ([id number]) scope was introduced through                            the anus and advanced to the the cecum, identified                            by appendiceal orifice and ileocecal valve. The                            colonoscopy was performed without difficulty. The                            patient tolerated the procedure well. The quality                            of the bowel preparation was evaluated using the                            BBPS [facility] Bowel Preparation Scale) with scores                            of: Right Colon = 3, Transverse Colon = 3 and Left                            Colon = 3 (entire mucosa seen well with no residual                            staining, small fragments of stool or opaque                            liquid). The total BBPS score equals 9. The                            ileocecal valve, appendiceal orifice, and rectum                            were photographed. Scope In: 3:15:21 PM Scope Out: 3:37:01 PM Scope Withdrawal Time: 0 hours 17 minutes 58 seconds  Total Procedure Duration: 0 hours 21 minutes 40 seconds  Findings:      The perianal and digital rectal examinations were normal.      Six sessile polyps were found in the transverse colon, ascending colon       and cecum. The polyps were 5 to 12 mm in size. These polyps were removed       with a cold snare. Resection and retrieval were complete.      Scattered large-mouthed diverticula were found in the entire colon.      Non-bleeding internal hemorrhoids were found during retroflexion. The       hemorrhoids were small. Impression:               - Six 5 to 12 mm polyps in the transverse colon, in                            the ascending colon and in the cecum, removed with                            a cold snare. Resected and retrieved.                           - Diverticulosis in the entire examined colon.                            - Non-bleeding internal hemorrhoids. Moderate Sedation:      Per Anesthesia Care Recommendation:           - Patient has a contact number available for                            emergencies. The signs and symptoms of potential                            delayed complications were discussed with the                            patient. Return to normal activities tomorrow.                            Written discharge instructions were provided to the                            patient.                           - Resume previous diet.                           - Continue present medications.                           - Await pathology results.                           - Repeat colonoscopy in 3 years for surveillance  based on pathology results.                           - Return to GI office as previously scheduled. Procedure Code(s):        --- Professional ---                           [phone], Colonoscopy, flexible; with removal of                            tumor(s), polyp(s), or other lesion(s) by snare                            technique Diagnosis Code(s):        --- Professional ---                           Z12.11, Encounter for screening for malignant                            neoplasm of colon                           D12.3, Benign neoplasm of transverse colon (hepatic                            flexure or splenic flexure)                           D12.2, Benign neoplasm of ascending colon                           D12.0, Benign neoplasm of cecum                           K64.8, Other hemorrhoids                           K57.30, Diverticulosis of large intestine without                            perforation or abscess without bleeding CPT copyright 2022 American Medical Association. All rights reserved. The codes documented in this report are preliminary and upon coder review may  be revised to meet current compliance  requirements. Deatrice Dine, MD Deatrice Dine, MD 07/16/2024 3:47:53 PM This report has been signed electronically. Number of Addenda: 0

## 2024-07-16 NOTE — Anesthesia Preprocedure Evaluation (Addendum)
 Anesthesia Evaluation  Patient identified by MRN, date of birth, ID band Patient awake    Reviewed: Allergy & Precautions, H&P , NPO status , Patient's Chart, lab work & pertinent test results  Airway Mallampati: II  TM Distance: >3 FB Neck ROM: Full    Dental no notable dental hx.    Pulmonary asthma    Pulmonary exam normal breath sounds clear to auscultation       Cardiovascular hypertension, Normal cardiovascular exam Rhythm:Regular Rate:Normal     Neuro/Psych negative neurological ROS  negative psych ROS   GI/Hepatic negative GI ROS, Neg liver ROS,,,  Endo/Other  diabetes    Renal/GU negative Renal ROS  negative genitourinary   Musculoskeletal  (+) Arthritis ,    Abdominal   Peds negative pediatric ROS (+)  Hematology negative hematology ROS (+)   Anesthesia Other Findings   Reproductive/Obstetrics negative OB ROS                              Anesthesia Physical Anesthesia Plan  ASA: 2  Anesthesia Plan: General   Post-op Pain Management:    Induction: Intravenous  PONV Risk Score and Plan:   Airway Management Planned: Nasal Cannula  Additional Equipment:   Intra-op Plan:   Post-operative Plan:   Informed Consent: I have reviewed the patients History and Physical, chart, labs and discussed the procedure including the risks, benefits and alternatives for the proposed anesthesia with the patient or authorized representative who has indicated his/her understanding and acceptance.     Dental advisory given  Plan Discussed with: CRNA  Anesthesia Plan Comments:         Anesthesia Quick Evaluation

## 2024-07-16 NOTE — H&P (Signed)
 Primary Care Physician:  Bertell Satterfield, MD Primary Gastroenterologist:  Dr. Cinderella  Pre-Procedure History & Physical: HPI:  Nancy [name] is a 63 y.o. female with diabetes on Rybelsus and hypertension who presents for evaluation of Abdominal pain, altered bowel movement, diarrhea, screening colonoscopy   Patient reports she had sudden onset epigastric pain radiating to left upper and left lower quadrant abdomen which woke her in the middle of the night.  Since then her bowel movements have been altered with liquid stools mostly.  Patient is feeling uncomfortable with epigastric left lower abdominal discomfort.  Patient denies any NSAID use The patient denies having any nausea, vomiting, fever, chills, hematochezia, melena, hematemesis jaundice, pruritus or weight loss.   Last ZHI:wnwz Last Colonoscopy:none   Recent labs 06/2024 H. pylori stool antigen negative Hemoglobin 13.2 ALT 37 AST 15    FHx: neg for any gastrointestinal/liver disease, no malignancies Social: neg smoking, alcohol or illicit drug use Surgical: no abdominal surgeries    Past Medical History:  Diagnosis Date   Asthma    Cataract    OU   Diabetes mellitus without complication (HCC)    Hypertension    Hypertensive retinopathy    OU   Retinal detachment 07/02/2019   OD    Past Surgical History:  Procedure Laterality Date   BREAST SURGERY     REPLACEMENT TOTAL KNEE     RETINAL DETACHMENT SURGERY Right 07/02/2019   Pneumatic retinopexy - Dr. Redell Hans   right foot      Prior to Admission medications   Medication Sig Start Date End Date Taking? Authorizing Provider  acetaminophen  (TYLENOL ) 650 MG CR tablet Take 650 mg by mouth every 8 (eight) hours as needed for pain or fever.    [provider]  albuterol  (PROVENTIL  HFA;VENTOLIN  HFA) 108 (90 BASE) MCG/ACT inhaler Inhale 2 puffs into the lungs every 6 (six) hours as needed. 03/11/14   Colette Torrence GRADE, MD  Blood Glucose Monitoring Suppl (BLOOD  GLUCOSE METER) kit Use as instructed, check sugars one time a day at least every other day. 03/29/14   Colette Torrence GRADE, MD  cephALEXin  (KEFLEX ) 500 MG capsule SMARTSIG:4 Capsule(s) By Mouth Once 05/05/24   [provider]  Dextromethorphan-guaiFENesin 10-100 MG/5ML liquid Take 5 mLs by mouth every 12 (twelve) hours.    [provider]  dorzolamide (TRUSOPT) 2 % ophthalmic solution Place 1 drop into both eyes 3 (three) times daily. 08/23/22   [provider]  fluticasone  (FLONASE ) 50 MCG/ACT nasal spray fluticasone  propionate 50 mcg/actuation nasal spray,suspension    [provider]  glipiZIDE  (GLUCOTROL ) 5 MG tablet Take 2.5 mg by mouth daily. 04/19/23   [provider]  Lancets [facility] CATHRY[place] PLUS [place]) MISC  01/05/19   [provider]  latanoprost (XALATAN) 0.005 % ophthalmic solution Place into both eyes at bedtime. 08/23/22   [provider]  levofloxacin (LEVAQUIN) 500 MG tablet Take 500 mg by mouth daily. 08/13/23   [provider]  omeprazole  (PRILOSEC) 20 MG capsule Take 1 capsule (20 mg total) by mouth daily. 06/17/24   Jessi Jessop, Deatrice FALCON, MD  ondansetron  (ZOFRAN ) 4 MG tablet Take 4-8 mg by mouth every 6 (six) hours. 05/28/24   [provider]  ONE TOUCH ULTRA TEST test strip  11/18/18   [provider]  pantoprazole  (PROTONIX ) 40 MG tablet SMARTSIG:1 Tablet(s) By Mouth 06/08/24   [provider]  polyethylene glycol (MIRALAX  / GLYCOLAX ) 17 g packet Take 17 g by mouth 2 (  two) times daily. 06/17/24 09/15/24  Labradford Schnitker F, MD  rosuvastatin (CRESTOR) 10 MG tablet Take 10 mg by mouth daily.    [provider]  Semaglutide (RYBELSUS) 3 MG TABS Take 3 mg by mouth daily. Patient not taking: No sig reported    [provider]    Allergies as of 06/17/2024 - Review Complete 06/17/2024  Allergen Reaction Noted   Beeswax Anaphylaxis and Other (See Comments) 09/13/2022   Cantaloupe  (diagnostic) Anaphylaxis 05/11/2018   Ibuprofen Itching and Anaphylaxis 09/13/2022   Other Anaphylaxis 01/30/2015   Penicillins Hives, Other (See Comments), and Anaphylaxis 09/13/2022   Strawberry extract Anaphylaxis 01/30/2015   Watermelon flavoring agent (non-screening) Anaphylaxis 01/30/2015   Beef-derived drug products Swelling 08/15/2023   Honey  03/20/2024   Metformin   03/20/2024   Metformin  and related Other (See Comments) 01/30/2015   Mixed feathers  03/20/2024    Family History  Problem Relation Age of Onset   Anesthesia problems Other        family history    Diabetes Other        family history    Glaucoma Sister    Glaucoma Brother     Social History   Socioeconomic History   Marital status: Single    Spouse name: Not on file   Number of children: Not on file   Years of education: Not on file   Highest education level: Not on file  Occupational History   Occupation: work with computers   Tobacco Use   Smoking status: Never   Smokeless tobacco: Never  Substance and Sexual Activity   Alcohol use: No   Drug use: No   Sexual activity: Not on file  Other Topics Concern   Not on file  Social History Narrative   Not on file   Social Drivers of Health   Financial Resource Strain: Not on file  Food Insecurity: Not on file  Transportation Needs: Not on file  Physical Activity: Not on file  Stress: Not on file  Social Connections: Not on file  Intimate Partner Violence: Not on file    Review of Systems: See HPI, otherwise negative ROS  Physical Exam: Vital signs in last 24 hours:     General:   Alert,  Well-developed, well-nourished, pleasant and cooperative in NAD Head:  Normocephalic and atraumatic. Eyes:  Sclera clear, no icterus.   Conjunctiva pink. Ears:  Normal auditory acuity. Nose:  No deformity, discharge,  or lesions. Msk:  Symmetrical without gross deformities. Normal posture. Extremities:  Without clubbing or edema. Neurologic:  Alert  and  oriented x4;  grossly normal neurologically. Skin:  Intact without significant lesions or rashes. Psych:  Alert and cooperative. Normal mood and affect.  Impression/Plan: Nancy [name] is a 63 y.o. female with diabetes on Rybelsus and hypertension who presents for evaluation of Abdominal pain, altered bowel movement, diarrhea, screening colonoscopy  Proceed with Egd and Colonoscopy   The risks of the procedure including infection, bleed, or perforation as well as benefits, limitations, alternatives and imponderables have been reviewed with the patient. Questions have been answered. All parties agreeable.

## 2024-07-17 ENCOUNTER — Encounter (HOSPITAL_COMMUNITY): Payer: Self-pay | Admitting: Gastroenterology

## 2024-07-17 LAB — GLUCOSE, CAPILLARY: Glucose-Capillary: 72 mg/dL (ref 70–99)

## 2024-07-21 ENCOUNTER — Ambulatory Visit (INDEPENDENT_AMBULATORY_CARE_PROVIDER_SITE_OTHER): Payer: Self-pay | Admitting: Gastroenterology

## 2024-07-21 LAB — SURGICAL PATHOLOGY

## 2024-07-23 NOTE — Progress Notes (Signed)
 3 yr TCS noted in recall 1 yr EGD noted in recall Patient result letter mailed procedure note and pathology result faxed to PCP

## 2024-07-29 ENCOUNTER — Telehealth (INDEPENDENT_AMBULATORY_CARE_PROVIDER_SITE_OTHER): Payer: Self-pay | Admitting: Gastroenterology

## 2024-07-29 NOTE — Telephone Encounter (Signed)
 Pt left message to see if all test results were back and to see if she could start back exercising at the gym. Please advise. Thank you.

## 2024-07-30 ENCOUNTER — Encounter (INDEPENDENT_AMBULATORY_CARE_PROVIDER_SITE_OTHER): Payer: Self-pay

## 2024-07-30 NOTE — Telephone Encounter (Signed)
 MyChart message sent to patient.

## 2024-07-30 NOTE — Telephone Encounter (Signed)
 Pt replied to my chart message: Thank you

## 2024-07-31 ENCOUNTER — Ambulatory Visit: Payer: Self-pay | Admitting: Family Medicine

## 2024-07-31 ENCOUNTER — Encounter: Payer: Self-pay | Admitting: Family Medicine

## 2024-07-31 VITALS — BP 125/67 | HR 71 | Wt 213.6 lb

## 2024-07-31 DIAGNOSIS — E559 Vitamin D deficiency, unspecified: Secondary | ICD-10-CM

## 2024-07-31 DIAGNOSIS — K5792 Diverticulitis of intestine, part unspecified, without perforation or abscess without bleeding: Secondary | ICD-10-CM

## 2024-07-31 DIAGNOSIS — T22012A Burn of unspecified degree of left forearm, initial encounter: Secondary | ICD-10-CM

## 2024-07-31 DIAGNOSIS — R7301 Impaired fasting glucose: Secondary | ICD-10-CM

## 2024-07-31 DIAGNOSIS — Z1231 Encounter for screening mammogram for malignant neoplasm of breast: Secondary | ICD-10-CM

## 2024-07-31 DIAGNOSIS — Z114 Encounter for screening for human immunodeficiency virus [HIV]: Secondary | ICD-10-CM

## 2024-07-31 DIAGNOSIS — E1165 Type 2 diabetes mellitus with hyperglycemia: Secondary | ICD-10-CM | POA: Diagnosis not present

## 2024-07-31 DIAGNOSIS — T300 Burn of unspecified body region, unspecified degree: Secondary | ICD-10-CM | POA: Insufficient documentation

## 2024-07-31 DIAGNOSIS — Z1159 Encounter for screening for other viral diseases: Secondary | ICD-10-CM

## 2024-07-31 DIAGNOSIS — E7849 Other hyperlipidemia: Secondary | ICD-10-CM

## 2024-07-31 DIAGNOSIS — E038 Other specified hypothyroidism: Secondary | ICD-10-CM

## 2024-07-31 MED ORDER — BLOOD GLUCOSE MONITORING SUPPL DEVI: 1.0000 | Freq: Three times a day (TID) | 0 refills | Status: AC

## 2024-07-31 MED ORDER — LANCET DEVICE MISC
1.0000 | Freq: Three times a day (TID) | 0 refills | Status: DC
Start: 2024-07-31 — End: 2024-07-31

## 2024-07-31 MED ORDER — LANCETS MISC. MISC: 1.0000 | Freq: Three times a day (TID) | 0 refills | Status: DC

## 2024-07-31 MED ORDER — LANCETS MISC. MISC: 1.0000 | Freq: Three times a day (TID) | 0 refills | Status: AC

## 2024-07-31 MED ORDER — SILVER SULFADIAZINE 1 % EX CREA: 1.0000 | TOPICAL_CREAM | Freq: Every day | CUTANEOUS | 0 refills | Status: AC

## 2024-07-31 MED ORDER — BLOOD GLUCOSE TEST VI STRP: 1.0000 | ORAL_STRIP | Freq: Three times a day (TID) | 0 refills | Status: DC

## 2024-07-31 MED ORDER — LANCET DEVICE MISC: 1.0000 | Freq: Three times a day (TID) | 0 refills | Status: AC

## 2024-07-31 MED ORDER — BLOOD GLUCOSE TEST VI STRP: 1.0000 | ORAL_STRIP | Freq: Three times a day (TID) | 0 refills | Status: AC

## 2024-07-31 MED ORDER — BLOOD GLUCOSE MONITORING SUPPL DEVI: 1.0000 | Freq: Three times a day (TID) | 0 refills | Status: DC

## 2024-07-31 NOTE — Patient Instructions (Addendum)
 I appreciate the opportunity to provide care to you today!    Follow up:  2 weeks for pap smear  Labs: please stop by the lab today to get your blood drawn (CBC, CMP, TSH, Lipid profile, HgA1c, Vit D)  Screening: HIV and Hep C  Schedule mammogram  Diverticulitis Diet -High-fiber diet to promote regular bowel movements and reduce pressure in the colon. -Foods rich in soluble and insoluble fiber: fruits, vegetables, whole grains, legumes. -Plenty of fluids (water ) to help fiber work well. -Avoid: Excessive red meat and foods with small seeds or nuts if they cause discomfort.  Please follow up if your symptoms worsen or fail to improve.  Attached with your AVS, you will find valuable resources for self-education. I highly recommend dedicating some time to thoroughly examine them.   Please continue to a heart-healthy diet and increase your physical activities. Try to exercise for at least five days a week.    It was a pleasure to see you and I look forward to continuing to work together on your health and well-being. Please do not hesitate to call the office if you need care or have questions about your care.  In case of emergency, please visit the Emergency Department for urgent care, or contact our clinic at [phone] to schedule an appointment. We're here to help you!   Have a wonderful day and week. With Gratitude, Cidney Kirkwood MSN, FNP-BC

## 2024-07-31 NOTE — Assessment & Plan Note (Signed)
 The burn site is healing appropriately with no evidence of infection (no erythema, swelling, pus, or increasing pain). Patient encouraged to apply silver  sulfadiazine  1% cream once daily to the affected area. Continue to keep the area clean, dry, and loosely covered with a sterile, non-stick dressing to protect from friction or contamination. Avoid applying home remedies such as cocoa butter or petroleum jelly until the burn is fully healed. Educated on signs of infection to monitor for: increasing redness, swelling, warmth, pus drainage, foul odor, fever, or spreading pain. If these occur, patient should seek medical attention promptly. Reinforced importance of hand hygiene before applying topical treatment or changing dressings. Follow up as needed or sooner if worsening symptoms occur.

## 2024-07-31 NOTE — Assessment & Plan Note (Signed)
 Diverticulitis Diet -High-fiber diet to promote regular bowel movements and reduce pressure in the colon. -Foods rich in soluble and insoluble fiber: fruits, vegetables, whole grains, legumes. -Plenty of fluids (water ) to help fiber work well. -Avoid: Excessive red meat and foods with small seeds or nuts if they cause discomfort.

## 2024-07-31 NOTE — Progress Notes (Signed)
 New Patient Office Visit  Subjective:  Patient ID: Nancy [name], female    DOB: [birth year]  Age: 63 y.o. MRN: [id number]  CC:  Chief Complaint  Patient presents with   Establish Care   Diabetes   severe diverticulitis    Would like to know if there are any dieticians she can be referred to and if there are any free classes per cone    hepA vac    Would like to get injection today   Medication Refill    Accu-chek soft clix 100 lancets, test strips   Burn    Left forearm, burned catching a corn bread pan     HPI Nancy [name] is a 63 y.o. female with past medical history of  type 2 diabetes presents for establishing care.  The patient reports that she received the Hepatitis A vaccine in July 2025 and the Tdap vaccine on 06/29/2024.  She presents today with a burn injury to the left forearm sustained at work. She explains that while working in [employer], she was removing a pan of cornbread from the oven when it slipped, causing the hot pan to come into contact with her arm. Since the incident, she has been applying Neosporin and cocoa butter/Vaseline to the burn site.    Past Medical History:  Diagnosis Date   Asthma    Cataract    OU   Diabetes mellitus without complication (HCC)    Diverticulitis    severe dx 07/11/2024   Hypertension    Hypertensive retinopathy    OU   Retinal detachment 07/02/2019   OD    Past Surgical History:  Procedure Laterality Date   BREAST SURGERY     COLONOSCOPY N/A 07/16/2024   Procedure: COLONOSCOPY;  Surgeon: Cinderella Deatrice FALCON, MD;  [facility];  Service: Endoscopy;  Laterality: N/A;  7:30AM;ASA 1-2, moved to last case of the day per PAT   ESOPHAGOGASTRODUODENOSCOPY N/A 07/16/2024   Procedure: EGD (ESOPHAGOGASTRODUODENOSCOPY);  Surgeon: Cinderella Deatrice FALCON, MD;  [facility];  Service: Endoscopy;  Laterality: N/A;  7:30AM;ASA 1-2   REPLACEMENT TOTAL KNEE     RETINAL DETACHMENT SURGERY Right 07/02/2019   Pneumatic  retinopexy - Dr. Redell Hans   right foot      Family History  Problem Relation Age of Onset   Anesthesia problems Other        family history    Diabetes Other        family history    Glaucoma Sister    Glaucoma Brother     Social History   Socioeconomic History   Marital status: Single    Spouse name: Not on file   Number of children: Not on file   Years of education: Not on file   Highest education level: Some college, no degree  Occupational History   Occupation: work with computers   Tobacco Use   Smoking status: Never   Smokeless tobacco: Never  Substance and Sexual Activity   Alcohol use: No   Drug use: No   Sexual activity: Not Currently  Other Topics Concern   Not on file  Social History Narrative   Not on file   Social Drivers of Health   Financial Resource Strain: Low Risk  (07/24/2024)   Overall Financial Resource Strain (CARDIA)    Difficulty of Paying Living Expenses: Not very hard  Food Insecurity: No Food Insecurity (07/24/2024)   Hunger Vital Sign    Worried About  Running Out of Food in the Last Year: Never true    Ran Out of Food in the Last Year: Never true  Transportation Needs: No Transportation Needs (07/24/2024)   PRAPARE - Administrator, Civil Service (Medical): No    Lack of Transportation (Non-Medical): No  Physical Activity: Insufficiently Active (07/24/2024)   Exercise Vital Sign    Days of Exercise per Week: 2 days    Minutes of Exercise per Session: 60 min  Stress: No Stress Concern Present (07/24/2024)   [employer] of Occupational Health - Occupational Stress Questionnaire    Feeling of Stress: Not at all  Social Connections: Moderately Isolated (07/24/2024)   Social Connection and Isolation Panel    Frequency of Communication with Friends and Family: More than three times a week    Frequency of Social Gatherings with Friends and Family: More than three times a week    Attends Religious Services: Patient  declined    Database administrator or Organizations: Yes    Attends Banker Meetings: 1 to 4 times per year    Marital Status: Never married  Intimate Partner Violence: Not on file    ROS Review of Systems  Constitutional:  Negative for chills and fever.  Eyes:  Negative for visual disturbance.  Respiratory:  Negative for chest tightness and shortness of breath.   Skin:  Positive for rash.  Neurological:  Negative for dizziness and headaches.    Objective:   Today's Vitals: BP 125/67 (BP Location: Left Arm, Patient Position: Sitting, Cuff Size: Large)   Pulse 71   Wt 213 lb 9.6 oz (96.9 kg)   BMI 35.54 kg/m   Physical Exam HENT:     Head: Normocephalic.     Mouth/Throat:     Mouth: Mucous membranes are moist.  Cardiovascular:     Rate and Rhythm: Normal rate.     Heart sounds: Normal heart sounds.  Pulmonary:     Effort: Pulmonary effort is normal.     Breath sounds: Normal breath sounds.  Skin:    Findings: Rash present.  Neurological:     Mental Status: She is alert.      Assessment & Plan:   Burn Assessment & Plan: The burn site is healing appropriately with no evidence of infection (no erythema, swelling, pus, or increasing pain). Patient encouraged to apply silver  sulfadiazine  1% cream once daily to the affected area. Continue to keep the area clean, dry, and loosely covered with a sterile, non-stick dressing to protect from friction or contamination. Avoid applying home remedies such as cocoa butter or petroleum jelly until the burn is fully healed. Educated on signs of infection to monitor for: increasing redness, swelling, warmth, pus drainage, foul odor, fever, or spreading pain. If these occur, patient should seek medical attention promptly. Reinforced importance of hand hygiene before applying topical treatment or changing dressings. Follow up as needed or sooner if worsening symptoms occur.   Orders: -     Silver  sulfADIAZINE ; Apply 1  Application topically daily.  Dispense: 50 g; Refill: 0  Type 2 diabetes mellitus with hyperglycemia, without long-term current use of insulin (HCC) -     Microalbumin / creatinine urine ratio -     Blood Glucose Monitoring Suppl; 1 each by Does not apply route in the morning, at noon, and at bedtime. May substitute to any manufacturer covered by patient's insurance.  Dispense: 1 each; Refill: 0 -     Blood Glucose Test;  1 each by In Vitro route in the morning, at noon, and at bedtime. May substitute to any manufacturer covered by patient's insurance.  Dispense: 100 strip; Refill: 0 -     Lancet Device; 1 each by Does not apply route in the morning, at noon, and at bedtime. May substitute to any manufacturer covered by patient's insurance.  Dispense: 1 each; Refill: 0 -     Lancets Misc.; 1 each by Does not apply route in the morning, at noon, and at bedtime. May substitute to any manufacturer covered by patient's insurance.  Dispense: 100 each; Refill: 0  Diverticulitis Assessment & Plan: Diverticulitis Diet -High-fiber diet to promote regular bowel movements and reduce pressure in the colon. -Foods rich in soluble and insoluble fiber: fruits, vegetables, whole grains, legumes. -Plenty of fluids (water ) to help fiber work well. -Avoid: Excessive red meat and foods with small seeds or nuts if they cause discomfort.   Breast cancer screening by mammogram -     3D Screening Mammogram, Left and Right  IFG (impaired fasting glucose) -     Hemoglobin A1c  Vitamin D deficiency -     VITAMIN D 25 Hydroxy (Vit-D Deficiency, Fractures)  Need for hepatitis C screening test -     Hepatitis C antibody  Encounter for screening for HIV -     HIV Antibody (routine testing w rflx)  TSH (thyroid-stimulating hormone deficiency) -     TSH + free T4  Other hyperlipidemia -     Lipid panel -     CMP14+EGFR -     CBC with Differential/Platelet    Note: This chart has been completed using Licensed [employer] software, and while attempts have been made to ensure accuracy, certain words and phrases may not be transcribed as intended.   Follow-up: Return in about 2 weeks (around 08/14/2024) for pap smear.   Ivo Moga, FNP

## 2024-08-01 LAB — CBC WITH DIFFERENTIAL/PLATELET
Basophils Absolute: 0 x10E3/uL (ref 0.0–0.2)
Basos: 1 %
EOS (ABSOLUTE): 0.1 x10E3/uL (ref 0.0–0.4)
Eos: 2 %
Hematocrit: 43.6 % (ref 34.0–46.6)
Hemoglobin: 13.7 g/dL (ref 11.1–15.9)
Immature Grans (Abs): 0 x10E3/uL (ref 0.0–0.1)
Immature Granulocytes: 0 %
Lymphocytes Absolute: 2.7 x10E3/uL (ref 0.7–3.1)
Lymphs: 43 %
MCH: 26.3 pg — ABNORMAL LOW (ref 26.6–33.0)
MCHC: 31.4 g/dL — ABNORMAL LOW (ref 31.5–35.7)
MCV: 84 fL (ref 79–97)
Monocytes Absolute: 0.5 x10E3/uL (ref 0.1–0.9)
Monocytes: 8 %
Neutrophils Absolute: 2.9 x10E3/uL (ref 1.4–7.0)
Neutrophils: 46 %
Platelets: 267 x10E3/uL (ref 150–450)
RBC: 5.2 x10E6/uL (ref 3.77–5.28)
RDW: 16.2 % — ABNORMAL HIGH (ref 11.7–15.4)
WBC: 6.3 x10E3/uL (ref 3.4–10.8)

## 2024-08-01 LAB — VITAMIN D 25 HYDROXY (VIT D DEFICIENCY, FRACTURES): Vit D, 25-Hydroxy: 29.5 ng/mL — ABNORMAL LOW (ref 30.0–100.0)

## 2024-08-01 LAB — LIPID PANEL
Chol/HDL Ratio: 3.4 ratio (ref 0.0–4.4)
Cholesterol, Total: 161 mg/dL (ref 100–199)
HDL: 47 mg/dL (ref >39)
LDL Chol Calc (NIH): 99 mg/dL (ref 0–99)
Triglycerides: 76 mg/dL (ref 0–149)
VLDL Cholesterol Cal: 15 mg/dL (ref 5–40)

## 2024-08-01 LAB — CMP14+EGFR
ALT: 20 IU/L (ref 0–32)
AST: 16 IU/L (ref 0–40)
Albumin: 4.5 g/dL (ref 3.9–4.9)
Alkaline Phosphatase: 92 IU/L (ref 44–121)
BUN/Creatinine Ratio: 15 (ref 12–28)
BUN: 13 mg/dL (ref 8–27)
Bilirubin Total: 0.6 mg/dL (ref 0.0–1.2)
CO2: 23 mmol/L (ref 20–29)
Calcium: 9.7 mg/dL (ref 8.7–10.3)
Chloride: 104 mmol/L (ref 96–106)
Creatinine, Ser: 0.86 mg/dL (ref 0.57–1.00)
Globulin, Total: 2.8 g/dL (ref 1.5–4.5)
Glucose: 89 mg/dL (ref 70–99)
Potassium: 4.3 mmol/L (ref 3.5–5.2)
Sodium: 142 mmol/L (ref 134–144)
Total Protein: 7.3 g/dL (ref 6.0–8.5)
eGFR: 76 mL/min/1.73 (ref >59)

## 2024-08-01 LAB — HIV ANTIBODY (ROUTINE TESTING W REFLEX): HIV Screen 4th Generation wRfx: NONREACTIVE

## 2024-08-01 LAB — TSH+FREE T4
Free T4: 1.17 ng/dL (ref 0.82–1.77)
TSH: 1.11 u[IU]/mL (ref 0.450–4.500)

## 2024-08-01 LAB — HEPATITIS C ANTIBODY: Hep C Virus Ab: NONREACTIVE

## 2024-08-01 LAB — HEMOGLOBIN A1C
Est. average glucose Bld gHb Est-mCnc: 137 mg/dL
Hgb A1c MFr Bld: 6.4 % — ABNORMAL HIGH (ref 4.8–5.6)

## 2024-08-08 ENCOUNTER — Ambulatory Visit: Payer: Self-pay | Admitting: Family Medicine

## 2024-09-02 ENCOUNTER — Encounter (HOSPITAL_COMMUNITY): Payer: Self-pay

## 2024-09-07 ENCOUNTER — Encounter (HOSPITAL_COMMUNITY): Payer: Self-pay

## 2024-09-07 ENCOUNTER — Ambulatory Visit (HOSPITAL_COMMUNITY)
Admission: RE | Admit: 2024-09-07 | Discharge: 2024-09-07 | Disposition: A | Discharge status: Home / Self Care | Source: Ambulatory Visit | Attending: Family Medicine | Admitting: Family Medicine

## 2024-09-07 DIAGNOSIS — Z1231 Encounter for screening mammogram for malignant neoplasm of breast: Secondary | ICD-10-CM | POA: Diagnosis present

## 2024-09-09 ENCOUNTER — Ambulatory Visit (INDEPENDENT_AMBULATORY_CARE_PROVIDER_SITE_OTHER): Admitting: Gastroenterology

## 2024-09-09 ENCOUNTER — Encounter (INDEPENDENT_AMBULATORY_CARE_PROVIDER_SITE_OTHER): Payer: Self-pay | Admitting: Gastroenterology

## 2024-09-09 VITALS — BP 111/62 | HR 68 | Temp 97.1°F | Ht 65.0 in | Wt 211.8 lb

## 2024-09-09 DIAGNOSIS — K760 Fatty (change of) liver, not elsewhere classified: Secondary | ICD-10-CM

## 2024-09-09 DIAGNOSIS — K5900 Constipation, unspecified: Secondary | ICD-10-CM

## 2024-09-09 DIAGNOSIS — K5904 Chronic idiopathic constipation: Secondary | ICD-10-CM

## 2024-09-09 DIAGNOSIS — Z860100 Personal history of colon polyps, unspecified: Secondary | ICD-10-CM | POA: Insufficient documentation

## 2024-09-09 DIAGNOSIS — Z860101 Personal history of adenomatous and serrated colon polyps: Secondary | ICD-10-CM

## 2024-09-09 DIAGNOSIS — R748 Abnormal levels of other serum enzymes: Secondary | ICD-10-CM

## 2024-09-09 DIAGNOSIS — R1013 Epigastric pain: Secondary | ICD-10-CM

## 2024-09-09 DIAGNOSIS — Z860102 Personal history of hyperplastic colon polyps: Secondary | ICD-10-CM | POA: Diagnosis not present

## 2024-09-09 NOTE — Progress Notes (Signed)
 Nancy [name] , M.D. Gastroenterology & Hepatology [facility] [address] [place], [place] [address] Primary Care Physician: Nancy Meade PEDLAR, FNP [address] [place] [address]  Chief Complaint: Abdominal pain, altered bowel movement, colon and gastric polyps History of Present Illness:  Nancy [name] is a 63 y.o. female with diabetes on Rybelsus and hypertension who presents for evaluation of Abdominal pain, altered bowel movement, colon and gastric polyps  Patient was last seen 06/2024 where she underwent upper endoscopy and colonoscopy found to have gastric hyperplastic polyps and colon polyps which were tubular adenoma.  Patient underwent CT and abdominal ultrasound  Today patient reports she is asymptomatic.  After using Metamucil MiraLAX  she is having regular bowel movements abdominal pain has completely resolved.  She only takes omeprazole  as needed.  No active GI complaints.  Previously she had epigastric pain which would wake patient up while sleeping, this has resolved Patient denies any NSAID use The patient denies having any nausea, vomiting, fever, chills, hematochezia, melena, hematemesis jaundice, pruritus or weight loss.  Last EGD:07/2024  - Normal esophagus. - 2 cm hiatal hernia. - Three gastric polyps. Resected and retrieved. - Erythematous mucosa in the antrum. Biopsied. - Mucosal variant in the duodenum. Biopsied.  Last Colonoscopy:07/2024  - Six 5 to 12 mm polyps in the transverse colon, in the ascending colon and in the cecum, removed with a cold snare. Resected and retrieved. - Diverticulosis in the entire examined colon. - Non- bleeding internal hemorrhoids.  A. SMALL BOWEL, BIOPSY:  Duodenal mucosa with normal villous architecture.  No villous atrophy or increased intraepithelial lymphocytes.   B. DUODENUM, ABNORMAL MUCOSA, BIOPSY:  Lymphangiectasia.  Negative for dysplasia and malignancy.  See  comment.   C. STOMACH, BIOPSY:  Gastric antral mucosa with hyperemia and slight chronic inflammation.  Negative for Helicobacter pylori.   D. STOMACH, POLYPECTOMY:  Fundic gland polyps (2).  Gastric hyperplastic polyp (1).  Negative for Helicobacter pylori.  Negative for dysplasia and malignancy.   E. COLON, CECUM, ASCENDING, TRANSVERSE, POLYPECTOMY:  Tubular adenoma (4 fragments) without high grade dysplasia.  Inflammatory polyp (2 fragments).    Recent labs 06/2024 H. pylori stool antigen negative Hemoglobin 13.2 ALT 37 AST 15   FHx: neg for any gastrointestinal/liver disease, no malignancies Social: neg smoking, alcohol or illicit drug use Surgical: no abdominal surgeries  Past Medical History: Past Medical History:  Diagnosis Date   Asthma    Cataract    OU   Diabetes mellitus without complication (HCC)    Diverticulitis    severe dx 07/11/2024   Hypertension    Hypertensive retinopathy    OU   Retinal detachment 07/02/2019   OD    Past Surgical History: Past Surgical History:  Procedure Laterality Date   BREAST SURGERY     cyst removal at 63yrs old   COLONOSCOPY N/A 07/16/2024   Procedure: COLONOSCOPY;  Surgeon: Cinderella Deatrice FALCON, MD;  [facility];  Service: Endoscopy;  Laterality: N/A;  7:30AM;ASA 1-2, moved to last case of the day per PAT   ESOPHAGOGASTRODUODENOSCOPY N/A 07/16/2024   Procedure: EGD (ESOPHAGOGASTRODUODENOSCOPY);  Surgeon: Cinderella Deatrice FALCON, MD;  [facility];  Service: Endoscopy;  Laterality: N/A;  7:30AM;ASA 1-2   REPLACEMENT TOTAL KNEE     RETINAL DETACHMENT SURGERY Right 07/02/2019   Pneumatic retinopexy - Dr. Redell Hans   right foot      Family History: Family History  Problem Relation Age  of Onset   Anesthesia problems Other        family history    Diabetes Other        family history    Glaucoma Sister    Glaucoma Brother     Social History: Social History   Tobacco Use  Smoking Status Never   Smokeless Tobacco Never   Social History   Substance and Sexual Activity  Alcohol Use No   Social History   Substance and Sexual Activity  Drug Use No    Allergies: Allergies  Allergen Reactions   Beeswax Anaphylaxis and Other (See Comments)   Cantaloupe (Diagnostic) Anaphylaxis   Ibuprofen Itching and Anaphylaxis   Other Anaphylaxis    Lethargic, swelling of the eyes, drowsiness Eye drop (not any current eye drops)   Penicillins Hives, Other (See Comments) and Anaphylaxis   Strawberry Extract Anaphylaxis   Watermelon Flavoring Agent (Non-Screening) Anaphylaxis   Beef-Derived Drug Products Swelling   Honey     Eye swelling and blurred vision   Metformin      Eye swelling and blurred vision   Metformin  And Related Other (See Comments)    Lethargic, swelling of the eyes, drowsiness   Mixed Feathers     unknown    Medications: Current Outpatient Medications  Medication Sig Dispense Refill   acetaminophen  (TYLENOL ) 650 MG CR tablet Take 650 mg by mouth every 8 (eight) hours as needed for pain or fever.     albuterol  (PROVENTIL  HFA;VENTOLIN  HFA) 108 (90 BASE) MCG/ACT inhaler Inhale 2 puffs into the lungs every 6 (six) hours as needed. 8.5 g 1   Blood Glucose Monitoring Suppl (BLOOD GLUCOSE METER) kit Use as instructed, check sugars one time a day at least every other day. 1 each 0   Blood Glucose Monitoring Suppl DEVI 1 each by Does not apply route in the morning, at noon, and at bedtime. May substitute to any manufacturer covered by patient's insurance. 1 each 0   Dextromethorphan-guaiFENesin 10-100 MG/5ML liquid Take 5 mLs by mouth every 12 (twelve) hours.     dorzolamide (TRUSOPT) 2 % ophthalmic solution Place 1 drop into both eyes 3 (three) times daily.     fluticasone  (FLONASE ) 50 MCG/ACT nasal spray fluticasone  propionate 50 mcg/actuation nasal spray,suspension     glipiZIDE  (GLUCOTROL ) 5 MG tablet Take 2.5 mg by mouth daily.     Lancets (ONETOUCH DELICA PLUS  LANCET33G) MISC      latanoprost (XALATAN) 0.005 % ophthalmic solution Place into both eyes at bedtime.     omeprazole  (PRILOSEC) 20 MG capsule Take 1 capsule (20 mg total) by mouth daily. 60 capsule 3   ondansetron  (ZOFRAN ) 4 MG tablet Take 4-8 mg by mouth every 6 (six) hours.     ONE TOUCH ULTRA TEST test strip      pantoprazole  (PROTONIX ) 40 MG tablet SMARTSIG:1 Tablet(s) By Mouth     polyethylene glycol (MIRALAX  / GLYCOLAX ) 17 g packet Take 17 g by mouth 2 (two) times daily. 180 packet 0   rosuvastatin (CRESTOR) 10 MG tablet Take 10 mg by mouth daily.     Semaglutide (RYBELSUS) 3 MG TABS Take 3 mg by mouth daily.     silver  sulfADIAZINE  (SILVADENE ) 1 % cream Apply 1 Application topically daily. 50 g 0   No current facility-administered medications for this visit.    Review of Systems: GENERAL: negative for malaise, night sweats HEENT: No changes in hearing or vision, no nose bleeds or other nasal problems. NECK: Negative for  lumps, goiter, pain and significant neck swelling RESPIRATORY: Negative for cough, wheezing CARDIOVASCULAR: Negative for chest pain, leg swelling, palpitations, orthopnea GI: SEE HPI MUSCULOSKELETAL: Negative for joint pain or swelling, back pain, and muscle pain. SKIN: Negative for lesions, rash HEMATOLOGY Negative for prolonged bleeding, bruising easily, and swollen nodes. ENDOCRINE: Negative for cold or heat intolerance, polyuria, polydipsia and goiter. NEURO: negative for tremor, gait imbalance, syncope and seizures. The remainder of the review of systems is noncontributory.   Physical Exam: BP 111/62   Pulse 68   Temp (!) 97.1 F (36.2 C)   Ht 5' 5 (1.651 m)   Wt 211 lb 12.8 oz (96.1 kg)   BMI 35.25 kg/m  GENERAL: The patient is AO x3, in no acute distress. HEENT: Head is normocephalic and atraumatic. EOMI are intact. Mouth is well hydrated and without lesions. NECK: Supple. No masses LUNGS: Clear to auscultation. No presence of  rhonchi/wheezing/rales. Adequate chest expansion HEART: RRR, normal s1 and s2. ABDOMEN: Soft, epigastric and LLQ tenderness, no guarding, no peritoneal signs, and nondistended. BS +. No masses.   Imaging/Labs: as above     Latest Ref Rng & Units 07/31/2024   10:24 AM 08/15/2023   10:22 PM 01/30/2015    3:00 PM  CBC  WBC 3.4 - 10.8 x10E3/uL 6.3  7.9  10.5   Hemoglobin 11.1 - 15.9 g/dL 86.2  87.1  86.2   Hematocrit 34.0 - 46.6 % 43.6  38.7  40.6   Platelets 150 - 450 x10E3/uL 267  263  229    Lab Results  Component Value Date   IRON 39 06/17/2024   TIBC 266 06/17/2024   FERRITIN 328 (H) 06/17/2024    I personally reviewed and interpreted the available labs, imaging and endoscopic files.  CT 06/2024  IMPRESSION: 1. No acute intra-abdominal or pelvic pathology. 2. Severe distal colonic diverticulosis. No bowel obstruction. Normal appendix. 3.  Aortic Atherosclerosis (ICD10-I70.0).    Labs for elevated liver enzymes       Ferritin 328 iron saturation 15 ANA negative, ASMA negative INR 1.0   Hepatitis A nonimmune Hepatitis B core antibody negative Hepatitis B antibody positive Hepatitis B surface antigen negative Hepatitis C negative HIV negative  Impression and Plan: Nancy [name] is a 63 y.o. female with diabetes on Rybelsus and hypertension who presents for evaluation of Abdominal pain, altered bowel movement, colon and gastric polyps  #Epigastric pain-resolved #Constipation-improved #Gastric hyperplastic polyps  Upper endoscopy with 3 polyps removed biopsies with hyperplastic polyp Repeat EGD in 1 year with mapping  On my CT scan review there is evidence of significant stool burden throughout the colon specially left lower quadrant   This likely was symptomatic diverticular disease with underlying constipation.  No overt diverticulitis seen on recent CT scan but patient has severe diverticulosis  Continue with Metamucil and MiraLAX    Ensure adequate  fluid intake: Aim for 8 glasses of water  daily. 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  Liver enzymes have normalized now  Continue with healthy lifestyle with exercise and diet with goal of losing 10% body weight in the next 1 year  All questions were answered.  Yavier Snider Faizan Evoleht Hovatter, MD Gastroenterology and Hepatology [facility] Gastroenterology   This chart has been completed using [facility] Dictation software, and while attempts have been made to ensure accuracy , certain words and phrases may not be transcribed as intended

## 2024-09-16 ENCOUNTER — Inpatient Hospital Stay
Admission: RE | Admit: 2024-09-16 | Discharge: 2024-09-16 | Disposition: A | Discharge status: Home / Self Care | Payer: Self-pay | Source: Ambulatory Visit | Attending: Family Medicine | Admitting: Family Medicine

## 2024-09-16 ENCOUNTER — Other Ambulatory Visit (HOSPITAL_COMMUNITY): Payer: Self-pay | Admitting: Family Medicine

## 2024-09-16 ENCOUNTER — Other Ambulatory Visit: Payer: Self-pay | Admitting: Family Medicine

## 2024-09-16 ENCOUNTER — Inpatient Hospital Stay
Admission: RE | Admit: 2024-09-16 | Discharge: 2024-09-16 | Disposition: A | Payer: Self-pay | Source: Ambulatory Visit | Attending: Family Medicine

## 2024-09-16 DIAGNOSIS — Z1231 Encounter for screening mammogram for malignant neoplasm of breast: Secondary | ICD-10-CM

## 2024-09-16 DIAGNOSIS — R928 Other abnormal and inconclusive findings on diagnostic imaging of breast: Secondary | ICD-10-CM

## 2024-09-22 NOTE — Progress Notes (Signed)
 [facility] - Clinic Note  09/25/2024    CHIEF COMPLAINT Patient presents for Diabetic Eye Exam    HISTORY OF PRESENT ILLNESS: Nancy [name] is a 63 y.o. female who presents to the clinic today for:   HPI     Diabetic Eye Exam   Vision is stable.  Associated Symptoms Flashes.  Diabetes characteristics include Type 2.  This started 11 years ago.  Last Blood Glucose 104.  Last A1C 6.4.  I, the attending physician,  performed the HPI with the patient and updated documentation appropriately.        Comments   Retina Eval. Patient states Dr.Treadwell wanted patient to return to Dr.Cynthya Yam. Patient recently saw Dr.Cotter. Patient states vision has not changed that much.       Last edited by Valdemar Rogue, MD on 10/03/2024  3:45 PM.    Patient states the vision is doing good.   Referring physician: Bertell Satterfield, MD [address] [place],  [place] [address]  HISTORICAL INFORMATION:  Selected notes from the MEDICAL RECORD NUMBER Referred by Dr. Oneil Kawasaki for concern of retinal holes OD    CURRENT MEDICATIONS: Current Outpatient Medications (Ophthalmic Drugs)  Medication Sig   dorzolamide (TRUSOPT) 2 % ophthalmic solution Place 1 drop into both eyes 3 (three) times daily.   latanoprost (XALATAN) 0.005 % ophthalmic solution Place into both eyes at bedtime.   No current facility-administered medications for this visit. (Ophthalmic Drugs)   Current Outpatient Medications (Other)  Medication Sig   acetaminophen  (TYLENOL ) 650 MG CR tablet Take 650 mg by mouth every 8 (eight) hours as needed for pain or fever.   albuterol  (PROVENTIL  HFA;VENTOLIN  HFA) 108 (90 BASE) MCG/ACT inhaler Inhale 2 puffs into the lungs every 6 (six) hours as needed.   Blood Glucose Monitoring Suppl (BLOOD GLUCOSE METER) kit Use as instructed, check sugars one time a day at least every other day.   Blood Glucose Monitoring Suppl DEVI 1 each by Does not apply route in the  morning, at noon, and at bedtime. May substitute to any manufacturer covered by patient's insurance.   fluticasone  (FLONASE ) 50 MCG/ACT nasal spray fluticasone  propionate 50 mcg/actuation nasal spray,suspension   glipiZIDE  (GLUCOTROL ) 5 MG tablet Take 2.5 mg by mouth daily.   Lancets (ONETOUCH DELICA PLUS LANCET33G) MISC    ondansetron  (ZOFRAN ) 4 MG tablet Take 4-8 mg by mouth every 6 (six) hours.   rosuvastatin (CRESTOR) 10 MG tablet Take 10 mg by mouth daily.   Semaglutide (RYBELSUS) 3 MG TABS Take 3 mg by mouth daily.   silver  sulfADIAZINE  (SILVADENE ) 1 % cream Apply 1 Application topically daily.   ACCU-CHEK GUIDE TEST test strip USE AS DIRECTED TO TEST BLOOD SUGAR IN MORNING, NOON AND AT BEDTIME   Dextromethorphan-guaiFENesin 10-100 MG/5ML liquid Take 5 mLs by mouth every 12 (twelve) hours.   omeprazole  (PRILOSEC) 20 MG capsule Take 1 capsule (20 mg total) by mouth daily.   pantoprazole  (PROTONIX ) 40 MG tablet SMARTSIG:1 Tablet(s) By Mouth   No current facility-administered medications for this visit. (Other)   REVIEW OF SYSTEMS: ROS   Positive for: Endocrine, Eyes, Respiratory, Allergic/Imm Negative for: Constitutional, Gastrointestinal, Neurological, Skin, Genitourinary, Musculoskeletal, HENT, Cardiovascular, Psychiatric, Heme/Lymph Last edited by German Olam BRAVO, COT on 09/25/2024  9:21 AM.     ALLERGIES Allergies  Allergen Reactions   Beeswax Anaphylaxis and Other (See Comments)   Cantaloupe (Diagnostic) Anaphylaxis   Ibuprofen Itching and Anaphylaxis   Other Anaphylaxis  Lethargic, swelling of the eyes, drowsiness Eye drop (not any current eye drops)   Penicillins Hives, Other (See Comments) and Anaphylaxis   Strawberry Extract Anaphylaxis   Watermelon Flavoring Agent (Non-Screening) Anaphylaxis   Bovine (Beef) Protein-Containing Drug Products Swelling   Honey     Eye swelling and blurred vision   Metformin      Eye swelling and blurred vision   Metformin  And Related  Other (See Comments)    Lethargic, swelling of the eyes, drowsiness   Mixed Feathers     unknown   PAST MEDICAL HISTORY Past Medical History:  Diagnosis Date   Asthma    Cataract    OU   Diabetes mellitus without complication (HCC)    Diverticulitis    severe dx 07/11/2024   Hypertension    Hypertensive retinopathy    OU   Retinal detachment 07/02/2019   OD   Past Surgical History:  Procedure Laterality Date   BREAST SURGERY     cyst removal at 63yrs old   COLONOSCOPY N/A 07/16/2024   Procedure: COLONOSCOPY;  Surgeon: Cinderella Deatrice FALCON, MD;  [facility];  Service: Endoscopy;  Laterality: N/A;  7:30AM;ASA 1-2, moved to last case of the day per PAT   ESOPHAGOGASTRODUODENOSCOPY N/A 07/16/2024   Procedure: EGD (ESOPHAGOGASTRODUODENOSCOPY);  Surgeon: Cinderella Deatrice FALCON, MD;  [facility];  Service: Endoscopy;  Laterality: N/A;  7:30AM;ASA 1-2   REPLACEMENT TOTAL KNEE     RETINAL DETACHMENT SURGERY Right 07/02/2019   Pneumatic retinopexy - Dr. Redell Hans   right foot     FAMILY HISTORY Family History  Problem Relation Age of Onset   Anesthesia problems Other        family history    Diabetes Other        family history    Glaucoma Sister    Glaucoma Brother    SOCIAL HISTORY Social History   Tobacco Use   Smoking status: Never   Smokeless tobacco: Never  Substance Use Topics   Alcohol use: No   Drug use: No       OPHTHALMIC EXAM: Base Eye Exam     Visual Acuity (Snellen - Linear)       Right Left   Dist cc 20/25 -2 20/20 -3   Dist ph cc 20/20 -2 20/NI    Correction: Glasses         Tonometry (Tonopen, 9:41 AM)       Right Left   Pressure 8 9         Pupils       Dark Light Shape React APD   Right 3 2 Round Brisk None   Left 3 2 Round Brisk None         Visual Fields (Counting fingers)       Left Right    Full Full         Extraocular Movement       Right Left    Full, Ortho Full, Ortho          Neuro/Psych     Oriented x3: Yes   Mood/Affect: Normal         Dilation     Both eyes: 1.0% Mydriacyl, 2.5% Phenylephrine @ 9:41 AM           Slit Lamp and Fundus Exam     Slit Lamp Exam       Right Left   Lids/Lashes Mild Dermatochalasis - upper lid Dermatochalasis - upper lid,  mild Meibomian gland dysfunction   Conjunctiva/Sclera mild Melanosis mild Melanosis   Cornea mild Arcus, trace Punctate epithelial erosions mild Arcus, 1+ inferior Punctate epithelial erosions   Anterior Chamber Deep and clear Deep and clear   Iris Round and dilated, No NVI Round and dilated, No NVI   Lens 2+ Nuclear sclerosis, 2+ Cortical cataract 2+ Nuclear sclerosis, 2+ Cortical cataract   Anterior Vitreous Vitreous syneresis, PVD, vitreous condensations inferiorly Vitreous syneresis, Posterior vitreous detachment         Fundus Exam       Right Left   Disc Sharp rim; +cupping; mild pallor, Thin inferior rim Pink and Sharp, +cupping   C/D Ratio 0.7 0.6   Macula Flat, good foveal reflex, trace ERM, rare MA Flat, Good foveal reflex, mild Retinal pigment epithelial mottling, No heme or edema   Vessels attenuated, Tortuous, mild Copper wiring, mild AV crossing changes attenuated, mild tortuousity, Copper wiring   Periphery Retina reattached with stably improved SRF; good cryo changes around 1200; good laser changes from 1000-1230, pre-op: bullous superior Temporal RD starting at 0900 to 0100. Horse shoe retinal tear with bridging vessel superiorly (1100); lattice with retinal holes at 1030, pigmented lattice at 0730 with good laser surrounding, No new RT/RD/lattice Attached, pigmented lattice from 9569-9369 - good laser changes surrounding patches of lattice, No new RT/RD/lattice           Refraction     Wearing Rx       Sphere Cylinder Axis Add   Right -5.00 +0.50 094 +2.25   Left -4.50 +0.75 078 +2.25         Manifest Refraction (Auto)       Sphere Cylinder Axis Dist VA   Right  -4.75 +0.75 085 20/20-2   Left -4.25 +0.50 092 20/NI           IMAGING AND PROCEDURES  Imaging and Procedures for @TODAY @  OCT, Retina - OU - Both Eyes       Right Eye Quality was good. Central Foveal Thickness: 280. Progression has been stable. Findings include normal foveal contour, no IRF, no SRF, outer retinal atrophy (retina stably reattached; SRF completely resolved).   Left Eye Quality was good. Central Foveal Thickness: 273. Progression has been stable. Findings include normal foveal contour, no IRF, no SRF.   Notes *Images captured and stored on drive  Diagnosis / Impression:  OD: retina stably reattached; SRF completely resolved OS: NFP, no IRF/SRF  Clinical management:  See below  Abbreviations: NFP - Normal foveal profile. CME - cystoid macular edema. PED - pigment epithelial detachment. IRF - intraretinal fluid. SRF - subretinal fluid. EZ - ellipsoid zone. ERM - epiretinal membrane. ORA - outer retinal atrophy. ORT - outer retinal tubulation. SRHM - subretinal hyper-reflective material            ASSESSMENT/PLAN:    ICD-10-CM   1. Right retinal detachment  H33.21 OCT, Retina - OU - Both Eyes    2. Retinal edema  H35.81     3. Retinal tear of right eye  H33.311     4. Lattice degeneration of both retinas  H35.413     5. Retinal holes, bilateral  H33.323     6. 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 Essential hypertension  I10     8. Hypertensive retinopathy of both eyes  H35.033  9. Combined forms of age-related cataract of both eyes  H25.813     10. Glaucoma suspect of both eyes  H40.003      1-3. Rhegmatogenous retinal detachment, right eye  - s/p laser retinopexy OD for tear/focal RD and lattice (3.4.20) - 7.23.2020: acute bullous superior mac off detachment, onset of foveal involvement Thursday,  07/02/19 by history - detached from 0900 to 0100 oclock, fovea off, horseshoe tear at 1100 and patches of lattice w/ retinal holes at 1030 - s/p pneumatic cryopexy OD in office (07.23.20) -- good cryo changes surrounding breaks from 1030-1100  - supplemental fill-in laser (07.29.20) - good laser changes  - retina stably reattached - mild residual SRF completely resolved  - BCVA 20/20 from 20/40  - IOP 8 OD   - no new RT/RD  - f/u in 1-2 year, DFE, OCT  4,5.Lattice degeneration w/ atrophic holes, both eyes - patches of lattice located at 0730, 0800 and 1000 OD, 0430, 0530-0600 OS  - s/p laser retinopexy OD (03.04.20) -- good laser changes in place  - s/p laser retinopexy OS (03.12.20) -- good laser changes in place  - no new RT/RD or lattice OU  - monitor  6. 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   - The need for close monitoring of blood glucose, blood pressure, and serum lipids, avoiding cigarette or any type of tobacco, and the need for long term follow up was also discussed with patient. - exam shows rare MA - OCT without diabetic macular edema, right eye  - monitor  7,8. Hypertensive retinopathy OU  - discussed importance of tight BP control  - monitor  9. Mild Mixed for age related cataracts OU - The symptoms of cataract, surgical options, and treatments and risks were discussed with patient.  - discussed diagnosis and progression  - monitor  10. Glaucoma Suspect  - IOP today 8, 9  - c/d ratio: 0.7,0.5  - pt reports fam hx of glaucoma (brother)  - Latanoprost OU at bedtime, and Dorz OU TID per Dr. Alena    Ophthalmic Meds Ordered this visit:  No orders of the defined types were placed in this encounter.     Return in about 2 years (around 09/25/2026) for f/u, RD, DFE, OCT.  There are no Patient  Instructions on file for this visit.   Explained the diagnoses, plan, and follow up with the patient and they expressed understanding.  Patient expressed understanding of the importance of proper follow up care.   This document serves as a record of services personally performed by Redell JUDITHANN Hans, MD, PhD. It was created on their behalf by Delon Newness COT, an ophthalmic technician. The creation of this record is the provider's dictation and/or activities during the visit.    Electronically signed by: Delon Newness COT 10.14.25 3:46 PM  This document serves as a record of services personally performed by Redell JUDITHANN Hans, MD, PhD. It was created on their behalf by Wanda GEANNIE Keens, COT an ophthalmic technician. The creation of this record is the provider's dictation and/or activities during the visit.    Electronically signed by:  Wanda GEANNIE Keens, COT  10/03/24 3:46 PM  Redell JUDITHANN Hans, M.D., Ph.D. Diseases & Surgery of the Retina and Vitreous [facility] 09/25/2024   I have reviewed the above documentation for accuracy and completeness, and  I agree with the above. Redell JUDITHANN Hans, M.D., Ph.D. 10/03/24 3:48 PM   Abbreviations: M myopia (nearsighted); A astigmatism; H hyperopia (farsighted); P presbyopia; Mrx spectacle prescription;  CTL contact lenses; OD right eye; OS left eye; OU both eyes  XT exotropia; ET esotropia; PEK punctate epithelial keratitis; PEE punctate epithelial erosions; DES dry eye syndrome; MGD meibomian gland dysfunction; ATs artificial tears; PFAT's preservative free artificial tears; NSC nuclear sclerotic cataract; PSC posterior subcapsular cataract; ERM epi-retinal membrane; PVD posterior vitreous detachment; RD retinal detachment; DM diabetes mellitus; DR diabetic retinopathy; NPDR non-proliferative diabetic retinopathy; PDR proliferative diabetic retinopathy; CSME clinically significant macular edema; DME diabetic macular edema;  dbh dot blot hemorrhages; CWS cotton wool spot; POAG primary open angle glaucoma; C/D cup-to-disc ratio; HVF humphrey visual field; GVF goldmann visual field; OCT optical coherence tomography; IOP intraocular pressure; BRVO Branch retinal vein occlusion; CRVO central retinal vein occlusion; CRAO central retinal artery occlusion; BRAO branch retinal artery occlusion; RT retinal tear; SB scleral buckle; PPV pars plana vitrectomy; VH Vitreous hemorrhage; PRP panretinal laser photocoagulation; IVK intravitreal kenalog; VMT vitreomacular traction; MH Macular hole;  NVD neovascularization of the disc; NVE neovascularization elsewhere; AREDS age related eye disease study; ARMD age related macular degeneration; POAG primary open angle glaucoma; EBMD epithelial/anterior basement membrane dystrophy; ACIOL anterior chamber intraocular lens; IOL intraocular lens; PCIOL posterior chamber intraocular lens; Phaco/IOL phacoemulsification with intraocular lens placement; PRK photorefractive keratectomy; LASIK laser assisted in situ keratomileusis; HTN hypertension; DM diabetes mellitus; COPD chronic obstructive pulmonary disease

## 2024-09-23 ENCOUNTER — Encounter (INDEPENDENT_AMBULATORY_CARE_PROVIDER_SITE_OTHER): Payer: Self-pay | Admitting: Ophthalmology

## 2024-09-25 ENCOUNTER — Encounter (INDEPENDENT_AMBULATORY_CARE_PROVIDER_SITE_OTHER): Payer: Self-pay | Admitting: Ophthalmology

## 2024-09-25 ENCOUNTER — Ambulatory Visit (INDEPENDENT_AMBULATORY_CARE_PROVIDER_SITE_OTHER): Admitting: Ophthalmology

## 2024-09-25 DIAGNOSIS — H25813 Combined forms of age-related cataract, bilateral: Secondary | ICD-10-CM

## 2024-09-25 DIAGNOSIS — H35413 Lattice degeneration of retina, bilateral: Secondary | ICD-10-CM

## 2024-09-25 DIAGNOSIS — I10 Essential (primary) hypertension: Secondary | ICD-10-CM | POA: Diagnosis not present

## 2024-09-25 DIAGNOSIS — H33323 Round hole, bilateral: Secondary | ICD-10-CM | POA: Diagnosis not present

## 2024-09-25 DIAGNOSIS — H40003 Preglaucoma, unspecified, bilateral: Secondary | ICD-10-CM

## 2024-09-25 DIAGNOSIS — H35033 Hypertensive retinopathy, bilateral: Secondary | ICD-10-CM

## 2024-09-25 DIAGNOSIS — E113291 Type 2 diabetes mellitus with mild nonproliferative diabetic retinopathy without macular edema, right eye: Secondary | ICD-10-CM | POA: Diagnosis not present

## 2024-09-25 DIAGNOSIS — H3581 Retinal edema: Secondary | ICD-10-CM

## 2024-09-25 DIAGNOSIS — H3321 Serous retinal detachment, right eye: Secondary | ICD-10-CM

## 2024-09-25 DIAGNOSIS — H33311 Horseshoe tear of retina without detachment, right eye: Secondary | ICD-10-CM

## 2024-09-30 ENCOUNTER — Other Ambulatory Visit: Payer: Self-pay | Admitting: Medical Genetics

## 2024-09-30 ENCOUNTER — Other Ambulatory Visit: Payer: Self-pay | Admitting: Family Medicine

## 2024-09-30 DIAGNOSIS — E1165 Type 2 diabetes mellitus with hyperglycemia: Secondary | ICD-10-CM

## 2024-10-02 ENCOUNTER — Other Ambulatory Visit (HOSPITAL_COMMUNITY)

## 2024-10-03 ENCOUNTER — Encounter (INDEPENDENT_AMBULATORY_CARE_PROVIDER_SITE_OTHER): Payer: Self-pay | Admitting: Ophthalmology

## 2024-10-13 ENCOUNTER — Ambulatory Visit (HOSPITAL_COMMUNITY)
Admission: RE | Admit: 2024-10-13 | Discharge: 2024-10-13 | Disposition: A | Source: Ambulatory Visit | Attending: Family Medicine

## 2024-10-13 ENCOUNTER — Encounter (HOSPITAL_COMMUNITY): Payer: Self-pay

## 2024-10-13 ENCOUNTER — Ambulatory Visit (HOSPITAL_COMMUNITY)
Admission: RE | Admit: 2024-10-13 | Discharge: 2024-10-13 | Disposition: A | Discharge status: Home / Self Care | Source: Ambulatory Visit | Attending: Family Medicine | Admitting: Family Medicine

## 2024-10-13 DIAGNOSIS — R928 Other abnormal and inconclusive findings on diagnostic imaging of breast: Secondary | ICD-10-CM | POA: Insufficient documentation

## 2024-11-02 ENCOUNTER — Other Ambulatory Visit: Payer: Self-pay | Admitting: Family Medicine

## 2024-11-02 DIAGNOSIS — E1165 Type 2 diabetes mellitus with hyperglycemia: Secondary | ICD-10-CM

## 2024-11-11 ENCOUNTER — Other Ambulatory Visit

## 2024-11-12 ENCOUNTER — Encounter: Payer: Self-pay | Admitting: Family Medicine

## 2024-11-19 ENCOUNTER — Ambulatory Visit: Admitting: Family Medicine

## 2024-12-15 ENCOUNTER — Other Ambulatory Visit: Payer: Self-pay | Admitting: Medical Genetics

## 2024-12-15 DIAGNOSIS — Z006 Encounter for examination for normal comparison and control in clinical research program: Secondary | ICD-10-CM

## 2024-12-22 ENCOUNTER — Ambulatory Visit: Payer: Self-pay

## 2024-12-23 ENCOUNTER — Ambulatory Visit: Payer: Self-pay

## 2024-12-23 DIAGNOSIS — Z23 Encounter for immunization: Secondary | ICD-10-CM

## 2025-01-04 ENCOUNTER — Other Ambulatory Visit: Payer: Self-pay

## 2025-01-06 ENCOUNTER — Ambulatory Visit: Admitting: Orthopedic Surgery

## 2025-01-06 ENCOUNTER — Telehealth: Payer: Self-pay | Admitting: Orthopedic Surgery

## 2025-01-06 ENCOUNTER — Other Ambulatory Visit (INDEPENDENT_AMBULATORY_CARE_PROVIDER_SITE_OTHER): Payer: Self-pay

## 2025-01-06 ENCOUNTER — Encounter: Payer: Self-pay | Admitting: Orthopedic Surgery

## 2025-01-06 VITALS — BP 150/79 | HR 75 | Ht 65.0 in | Wt 214.0 lb

## 2025-01-06 DIAGNOSIS — M2142 Flat foot [pes planus] (acquired), left foot: Secondary | ICD-10-CM | POA: Diagnosis not present

## 2025-01-06 DIAGNOSIS — G8929 Other chronic pain: Secondary | ICD-10-CM

## 2025-01-06 DIAGNOSIS — M79672 Pain in left foot: Secondary | ICD-10-CM

## 2025-01-06 NOTE — Telephone Encounter (Signed)
 I called her and told her to try [employer] and she wanted me to [employer] message her what to look up so I told her  On [employer] look up full length heavy duty (rigid) orthotics and order, if you get them and you can bend through the arch of the orthotic it needs to be returned, you should not be able to bend the insert. They usually run about $40 I hope that helps   To you FYI can't get any orthotics at all now at [facility]
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
## 2025-01-06 NOTE — Progress Notes (Signed)
" °  Intake history:  Chief Complaint  Patient presents with   Foot Pain    Both / left currently hurts at arch      BP (!) 150/79   Pulse 75   Ht 5' 5 (1.651 m)   Wt 214 lb (97.1 kg)   BMI 35.61 kg/m  Body mass index is 35.61 kg/m.  Pharmacy? _____[facility]_______________  WHAT ARE WE SEEING YOU FOR TODAY?   Left foot/ arch   How long has this bothered you? (DOI?DOS?WS?)  approximately 2-3 week(s) ago  Was there an injury? No  Anticoag.  No   Any ALLERGIES ______________________________________________   Treatment:  Have you taken:  Tylenol  Yes didn't help so not taking   Advil Yes didn't help so not taking   Had PT No  Had injection No  Other  _______previous surgery right foot __________________     "

## 2025-01-06 NOTE — Telephone Encounter (Signed)
 Dr. Areatha pt - pt lvm stating she was seen today and was told to get an OTC insert from [place], they don't carry them anymore and stated that they haven't in 5 years.  She stated [facility] doesn't have them either.  She would like suggestions on what to do.  [phone]

## 2025-02-10 ENCOUNTER — Encounter: Payer: Self-pay | Admitting: Nurse Practitioner

## 2025-09-22 ENCOUNTER — Encounter (INDEPENDENT_AMBULATORY_CARE_PROVIDER_SITE_OTHER): Admitting: Ophthalmology
